# Patient Record
Sex: Female | Born: 1990 | Race: Black or African American | Hispanic: No | Marital: Single | Smoking: Current every day smoker
Health system: Southern US, Community
[De-identification: ages and names within clinical notes are randomized; demographics above are authoritative.]

## PROBLEM LIST (undated history)

## (undated) DIAGNOSIS — N200 Calculus of kidney: Secondary | ICD-10-CM

## (undated) DIAGNOSIS — O24419 Gestational diabetes mellitus in pregnancy, unspecified control: Secondary | ICD-10-CM

## (undated) DIAGNOSIS — N289 Disorder of kidney and ureter, unspecified: Secondary | ICD-10-CM

## (undated) DIAGNOSIS — O139 Gestational [pregnancy-induced] hypertension without significant proteinuria, unspecified trimester: Secondary | ICD-10-CM

## (undated) HISTORY — DX: Gestational (pregnancy-induced) hypertension without significant proteinuria, unspecified trimester: O13.9

## (undated) HISTORY — PX: URETERAL STENT PLACEMENT: SHX822

## (undated) HISTORY — DX: Gestational diabetes mellitus in pregnancy, unspecified control: O24.419

---

## 2014-02-23 ENCOUNTER — Emergency Department (HOSPITAL_COMMUNITY): Payer: Self-pay

## 2014-02-23 ENCOUNTER — Encounter (HOSPITAL_COMMUNITY): Payer: Self-pay | Admitting: Emergency Medicine

## 2014-02-23 ENCOUNTER — Emergency Department (HOSPITAL_COMMUNITY)
Admission: EM | Admit: 2014-02-23 | Discharge: 2014-02-23 | Disposition: A | Discharge status: Home / Self Care | Payer: Self-pay | Attending: Emergency Medicine | Admitting: Emergency Medicine

## 2014-02-23 DIAGNOSIS — N201 Calculus of ureter: Secondary | ICD-10-CM | POA: Insufficient documentation

## 2014-02-23 DIAGNOSIS — Z3202 Encounter for pregnancy test, result negative: Secondary | ICD-10-CM | POA: Insufficient documentation

## 2014-02-23 DIAGNOSIS — N23 Unspecified renal colic: Secondary | ICD-10-CM

## 2014-02-23 DIAGNOSIS — F17200 Nicotine dependence, unspecified, uncomplicated: Secondary | ICD-10-CM | POA: Insufficient documentation

## 2014-02-23 LAB — URINALYSIS, ROUTINE W REFLEX MICROSCOPIC
BILIRUBIN URINE: NEGATIVE
Glucose, UA: NEGATIVE mg/dL
Ketones, ur: NEGATIVE mg/dL
Nitrite: NEGATIVE
PH: 6 (ref 5.0–8.0)
Protein, ur: 30 mg/dL — AB
Specific Gravity, Urine: 1.023 (ref 1.005–1.030)
UROBILINOGEN UA: 1 mg/dL (ref 0.0–1.0)

## 2014-02-23 LAB — URINE MICROSCOPIC-ADD ON

## 2014-02-23 LAB — I-STAT CHEM 8, ED
BUN: 8 mg/dL (ref 6–23)
CREATININE: 0.7 mg/dL (ref 0.50–1.10)
Calcium, Ion: 1.18 mmol/L (ref 1.12–1.23)
Chloride: 106 mEq/L (ref 96–112)
GLUCOSE: 92 mg/dL (ref 70–99)
HEMATOCRIT: 41 % (ref 36.0–46.0)
Hemoglobin: 13.9 g/dL (ref 12.0–15.0)
POTASSIUM: 3.9 meq/L (ref 3.7–5.3)
SODIUM: 142 meq/L (ref 137–147)
TCO2: 20 mmol/L (ref 0–100)

## 2014-02-23 LAB — PREGNANCY, URINE: Preg Test, Ur: NEGATIVE

## 2014-02-23 MED ORDER — IBUPROFEN 800 MG PO TABS: 800.0000 mg | ORAL_TABLET | Freq: Three times a day (TID) | ORAL | Status: DC

## 2014-02-23 MED ORDER — HYDROMORPHONE HCL PF 1 MG/ML IJ SOLN
1.0000 mg | Freq: Once | INTRAMUSCULAR | Status: AC
  Administered 2014-02-23: 1 mg via INTRAVENOUS
  Filled 2014-02-23: qty 1

## 2014-02-23 MED ORDER — KETOROLAC TROMETHAMINE 30 MG/ML IJ SOLN
30.0000 mg | Freq: Once | INTRAMUSCULAR | Status: AC
  Administered 2014-02-23: 30 mg via INTRAVENOUS
  Filled 2014-02-23: qty 1

## 2014-02-23 MED ORDER — ONDANSETRON HCL 8 MG PO TABS: 8.0000 mg | ORAL_TABLET | Freq: Three times a day (TID) | ORAL | Status: DC | PRN

## 2014-02-23 MED ORDER — ONDANSETRON HCL 4 MG/2ML IJ SOLN
4.0000 mg | Freq: Once | INTRAMUSCULAR | Status: AC
  Administered 2014-02-23: 4 mg via INTRAVENOUS
  Filled 2014-02-23: qty 2

## 2014-02-23 NOTE — Progress Notes (Signed)
[facility] provided pt with a list of primary care resources and a [facility] [employer] Card application to help patient establish a pcp.  °

## 2014-02-23 NOTE — ED Notes (Signed)
Pt transported to CT ?

## 2014-02-23 NOTE — ED Notes (Signed)
Pt has hx of kidney stones.  Pt with sudden onset flank pain to left 2 hours ago

## 2014-02-23 NOTE — ED Provider Notes (Signed)
CSN: [id number]     Arrival date & time 02/23/14  1325 History   First MD Initiated Contact with Patient 02/23/14 1448     Chief Complaint  Patient presents with  . Flank Pain     (Consider location/radiation/quality/duration/timing/severity/associated sxs/prior Treatment) HPI  Complains of left flank pain onset 3 hours ago accompanied by several episodes of vomiting. Pain is nonradiating sharp feels like kidney stone she's had in the past. Nothing makes pain better or worse. No other associated symptoms. No treatment prior to coming here History reviewed. No pertinent past medical history. past medical history kidney stones Past Surgical History  Procedure Laterality Date  . Ureteral stent placement     History reviewed. No pertinent family history. History  Substance Use Topics  . Smoking status: Current Every Day Smoker -- 0.50 packs/day    Types: Cigarettes  . Smokeless tobacco: Not on file  . Alcohol Use: No   occasional alcohol use OB History   Grav Para Term Preterm Abortions TAB SAB Ect Mult Living                 Review of Systems  Constitutional: Negative.   HENT: Negative.   Respiratory: Negative.   Cardiovascular: Negative.   Gastrointestinal: Positive for vomiting.  Genitourinary: Positive for flank pain.       Lnmp 01/05/14  Musculoskeletal: Negative.   Skin: Negative.   Neurological: Negative.   Psychiatric/Behavioral: Negative.   All other systems reviewed and are negative.     Allergies  Review of patient's allergies indicates no known allergies.  Home Medications   Prior to Admission medications   Not on File   BP 121/81  Pulse 102  Temp(Src) 98 F (36.7 C) (Oral)  Resp 18  SpO2 100%  LMP 02/05/2014 Physical Exam  Nursing note and vitals reviewed. Constitutional: She appears well-developed and well-nourished. She appears distressed.  Appears mildly uncomfortable  HENT:  Head: Normocephalic and atraumatic.  Eyes: Conjunctivae are  normal. Pupils are equal, round, and reactive to light.  Neck: Neck supple. No tracheal deviation present. No thyromegaly present.  Cardiovascular: Normal rate and regular rhythm.   No murmur heard. Pulmonary/Chest: Effort normal and breath sounds normal.  Abdominal: Soft. Bowel sounds are normal. She exhibits no distension. There is no tenderness.  Musculoskeletal: Normal range of motion. She exhibits no edema and no tenderness.  Neurological: She is alert. Coordination normal.  Skin: Skin is warm and dry. No rash noted.  Psychiatric: She has a normal mood and affect.    ED Course  Procedures (including critical care time) Labs Review Labs Reviewed  PREGNANCY, URINE  URINALYSIS, ROUTINE W REFLEX MICROSCOPIC    Imaging Review No results found.   EKG Interpretation None     5 PM patient asymptomatic pain free after treatment with Zofran, Toradol and intravenous hydromorphone CT scan and discussed with radiologist likely passed ureteral stone left side. Results for orders placed during the hospital encounter of 02/23/14  URINALYSIS, ROUTINE W REFLEX MICROSCOPIC      Result Value Ref Range   Color, Urine AMBER (*) YELLOW   APPearance CLOUDY (*) CLEAR   Specific Gravity, Urine 1.023  1.005 - 1.030   pH 6.0  5.0 - 8.0   Glucose, UA NEGATIVE  NEGATIVE mg/dL   Hgb urine dipstick LARGE (*) NEGATIVE   Bilirubin Urine NEGATIVE  NEGATIVE   Ketones, ur NEGATIVE  NEGATIVE mg/dL   Protein, ur 30 (*) NEGATIVE mg/dL   Urobilinogen, UA 1.0  0.0 - 1.0 mg/dL   Nitrite NEGATIVE  NEGATIVE   Leukocytes, UA TRACE (*) NEGATIVE  PREGNANCY, URINE      Result Value Ref Range   Preg Test, Ur NEGATIVE  NEGATIVE  URINE MICROSCOPIC-ADD ON      Result Value Ref Range   Squamous Epithelial / LPF RARE  RARE   WBC, UA 0-2  <3 WBC/hpf   RBC / HPF TOO NUMEROUS TO COUNT  <3 RBC/hpf   Bacteria, UA RARE  RARE   Urine-Other MUCOUS PRESENT    I-STAT CHEM 8, ED      Result Value Ref Range   Sodium 142   137 - 147 mEq/L   Potassium 3.9  3.7 - 5.3 mEq/L   Chloride 106  96 - 112 mEq/L   BUN 8  6 - 23 mg/dL   Creatinine, Ser 1.610.70  0.50 - 1.10 mg/dL   Glucose, Bld 92  70 - 99 mg/dL   Calcium, Ion 0.961.18  0.451.12 - 1.23 mmol/L   TCO2 20  0 - 100 mmol/L   Hemoglobin 13.9  12.0 - 15.0 g/dL   HCT 40.941.0  81.136.0 - 91.446.0 %   Ct Abdomen Pelvis Wo Contrast  02/23/2014   CLINICAL DATA:  FLANK PAIN  EXAM: CT ABDOMEN AND PELVIS WITHOUT CONTRAST  TECHNIQUE: Multidetector CT imaging of the abdomen and pelvis was performed following the standard protocol without IV contrast.  COMPARISON:  None.  FINDINGS: Lung bases are negative.  Noncontrast evaluation of the liver, spleen, adrenals, pancreas is unremarkable.  A nonobstructing 4 mm calculus posterior mid pole right kidney. No evidence of obstructive uropathy nor ureteral lithiasis. Small 1 mm medullary calculi in the left kidney. There is trace pelvicaliectasis on the left and no evidence of ureterolithiasis. Multiple phleboliths are appreciated within the pelvis.  No abdominal or pelvic masses, free fluid, loculated fluid collections, no adenopathy within the limitations of a noncontrast CT.  The bowel, gallbladder fossa, and appendix are negative. Moderate amount of stool within the ascending colon. No abdominal aortic aneurysm.  There are no aggressive appearing osseous lesions.  IMPRESSION: Mild pelvic caliectasis within the left kidney. There are multiple 1 mm medullary calculi on the left and these findings may represent sequela of passage of a small calculus.  Nonobstructing medullary calculus right kidney  Within the limitations of a noncontrast CT no further evidence of obstructive or inflammatory abnormalities.   Electronically Signed   By: Salome HolmesHector  Cooper M.D.   On: 02/23/2014 15:29    MDM  Plan prescription ibuprofen, Zofran urology referral Final diagnoses:  None   diagnosis ureteral colic      Doug SouSam Jacubowitz, MD 02/23/14 1706

## 2014-02-23 NOTE — ED Notes (Signed)
Pt able to keep down ice water without vomiting.

## 2014-02-23 NOTE — ED Notes (Signed)
Pt reports onset of left flank pain starting 2 hours ago similar to kidney stone pain experienced in past. Pt reports n/v and pain but denies other symptoms.

## 2014-02-23 NOTE — ED Notes (Signed)
Pt actively vomiting. MD notified.  

## 2014-02-23 NOTE — Discharge Instructions (Signed)
Kidney Stones Call Dr. Madilyn HookNesi's office on 02/26/2014 to schedule an office visit. Tell office staff that you were seen here Kidney stones (urolithiasis) are solid masses that form inside your kidneys. The intense pain is caused by the stone moving through the kidney, ureter, bladder, and urethra (urinary tract). When the stone moves, the ureter starts to spasm around the stone. The stone is usually passed in your pee (urine).  HOME CARE  Drink enough fluids to keep your pee clear or pale yellow. This helps to get the stone out.  Strain all pee through the provided strainer. Do not pee without peeing through the strainer, not even once. If you pee the stone out, catch it in the strainer. The stone may be as small as a grain of salt. Take this to your doctor. This will help your doctor figure out what you can do to try to prevent more kidney stones.  Only take medicine as told by your doctor.  Follow up with your doctor as told.  Get follow-up X-rays as told by your doctor. GET HELP IF: You have pain that gets worse even if you have been taking pain medicine. GET HELP RIGHT AWAY IF:   Your pain does not get better with medicine.  You have a fever or shaking chills.  Your pain increases and gets worse over 18 hours.  You have new belly (abdominal) pain.  You feel faint or pass out.  You are unable to pee. MAKE SURE YOU:   Understand these instructions.  Will watch your condition.  Will get help right away if you are not doing well or get worse. Document Released: 02/03/2008 Document Revised: 04/19/2013 Document Reviewed: 01/18/2013 [facility] Patient Information 2015 [place], [place]. This information is not intended to replace advice given to you by your health care provider. Make sure you discuss any questions you have with your health care provider.

## 2014-04-10 ENCOUNTER — Encounter (HOSPITAL_COMMUNITY): Payer: Self-pay | Admitting: Emergency Medicine

## 2014-04-10 ENCOUNTER — Emergency Department (HOSPITAL_COMMUNITY)
Admission: EM | Admit: 2014-04-10 | Discharge: 2014-04-10 | Disposition: A | Discharge status: Home / Self Care | Payer: Self-pay | Attending: Emergency Medicine | Admitting: Emergency Medicine

## 2014-04-10 DIAGNOSIS — F17200 Nicotine dependence, unspecified, uncomplicated: Secondary | ICD-10-CM | POA: Insufficient documentation

## 2014-04-10 DIAGNOSIS — Z9889 Other specified postprocedural states: Secondary | ICD-10-CM | POA: Insufficient documentation

## 2014-04-10 DIAGNOSIS — R300 Dysuria: Secondary | ICD-10-CM | POA: Insufficient documentation

## 2014-04-10 DIAGNOSIS — Z79899 Other long term (current) drug therapy: Secondary | ICD-10-CM | POA: Insufficient documentation

## 2014-04-10 DIAGNOSIS — Z87442 Personal history of urinary calculi: Secondary | ICD-10-CM | POA: Insufficient documentation

## 2014-04-10 DIAGNOSIS — N390 Urinary tract infection, site not specified: Secondary | ICD-10-CM | POA: Insufficient documentation

## 2014-04-10 DIAGNOSIS — Z791 Long term (current) use of non-steroidal anti-inflammatories (NSAID): Secondary | ICD-10-CM | POA: Insufficient documentation

## 2014-04-10 DIAGNOSIS — Z3202 Encounter for pregnancy test, result negative: Secondary | ICD-10-CM | POA: Insufficient documentation

## 2014-04-10 HISTORY — DX: Calculus of kidney: N20.0

## 2014-04-10 HISTORY — DX: Disorder of kidney and ureter, unspecified: N28.9

## 2014-04-10 LAB — URINE MICROSCOPIC-ADD ON

## 2014-04-10 LAB — URINALYSIS, ROUTINE W REFLEX MICROSCOPIC
Bilirubin Urine: NEGATIVE
GLUCOSE, UA: NEGATIVE mg/dL
Ketones, ur: NEGATIVE mg/dL
Nitrite: NEGATIVE
Protein, ur: NEGATIVE mg/dL
SPECIFIC GRAVITY, URINE: 1.018 (ref 1.005–1.030)
Urobilinogen, UA: 1 mg/dL (ref 0.0–1.0)
pH: 7 (ref 5.0–8.0)

## 2014-04-10 LAB — POC URINE PREG, ED: Preg Test, Ur: NEGATIVE

## 2014-04-10 MED ORDER — CEPHALEXIN 500 MG PO CAPS
500.0000 mg | ORAL_CAPSULE | Freq: Once | ORAL | Status: AC
  Administered 2014-04-10: 500 mg via ORAL
  Filled 2014-04-10: qty 1

## 2014-04-10 MED ORDER — CEPHALEXIN 500 MG PO CAPS: 500.0000 mg | ORAL_CAPSULE | Freq: Four times a day (QID) | ORAL | Status: DC

## 2014-04-10 MED ORDER — HYDROCODONE-ACETAMINOPHEN 5-325 MG PO TABS
2.0000 | ORAL_TABLET | Freq: Once | ORAL | Status: AC
  Administered 2014-04-10: 2 via ORAL
  Filled 2014-04-10: qty 2

## 2014-04-10 NOTE — ED Provider Notes (Signed)
CSN: [id number]     Arrival date & time 04/10/14  1105 History   First MD Initiated Contact with Patient 04/10/14 1127     Chief Complaint  Patient presents with  . Dysuria     (Consider location/radiation/quality/duration/timing/severity/associated sxs/prior Treatment) Patient is a 23 y.o. female presenting with dysuria. The history is provided by the patient. No language interpreter was used.  Dysuria Pain quality:  Aching Pain severity:  Moderate Onset quality:  Gradual Duration:  1 day Timing:  Constant Progression:  Unchanged Chronicity:  New Recent urinary tract infections: no   Relieved by:  Nothing Worsened by:  Nothing tried Ineffective treatments:  None tried Urinary symptoms: frequent urination   Urinary symptoms: no discolored urine   Associated symptoms: abdominal pain   Associated symptoms: no fever, no nausea and no vomiting   Abdominal pain:    Location:  Suprapubic   Quality:  Aching   Severity:  Moderate   Onset quality:  Gradual   Duration:  1 day   Timing:  Constant   Progression:  Unchanged   Chronicity:  New Risk factors: sexually active   Risk factors: no hx of pyelonephritis, no hx of urolithiasis, no kidney transplant and no renal disease     Past Medical History  Diagnosis Date  . Renal disorder   . Kidney stones    Past Surgical History  Procedure Laterality Date  . Ureteral stent placement     No family history on file. History  Substance Use Topics  . Smoking status: Current Every Day Smoker -- 0.50 packs/day    Types: Cigarettes  . Smokeless tobacco: Not on file  . Alcohol Use: No   OB History   Grav Para Term Preterm Abortions TAB SAB Ect Mult Living                 Review of Systems  Constitutional: Negative for fever, chills and fatigue.  HENT: Negative for trouble swallowing.   Eyes: Negative for visual disturbance.  Respiratory: Negative for shortness of breath.   Cardiovascular: Negative for chest pain and  palpitations.  Gastrointestinal: Positive for abdominal pain. Negative for nausea, vomiting and diarrhea.  Genitourinary: Positive for dysuria. Negative for difficulty urinating.  Musculoskeletal: Negative for arthralgias and neck pain.  Skin: Negative for color change.  Neurological: Negative for dizziness and weakness.  Psychiatric/Behavioral: Negative for dysphoric mood.      Allergies  Review of patient's allergies indicates no known allergies.  Home Medications   Prior to Admission medications   Medication Sig Start Date End Date Taking? Authorizing Provider  acetaminophen (TYLENOL) 325 MG tablet Take 650 mg by mouth every 6 (six) hours as needed (pain.).   Yes Historical Provider, MD  ibuprofen (ADVIL,MOTRIN) 200 MG tablet Take 200 mg by mouth every 6 (six) hours as needed (pain.).   Yes Historical Provider, MD   BP 134/93  Pulse 91  Temp(Src) 99 F (37.2 C) (Oral)  Resp 17  SpO2 100%  LMP 04/03/2014 Physical Exam  Nursing note and vitals reviewed. Constitutional: She is oriented to person, place, and time. She appears well-developed and well-nourished. No distress.  HENT:  Head: Normocephalic and atraumatic.  Eyes: Conjunctivae are normal.  Neck: Normal range of motion.  Cardiovascular: Normal rate and regular rhythm.  Exam reveals no gallop and no friction rub.   No murmur heard. Pulmonary/Chest: Effort normal and breath sounds normal. She has no wheezes. She has no rales. She exhibits no tenderness.  Abdominal: Soft.  She exhibits no distension. There is tenderness. There is no rebound and no guarding.  Suprapubic tenderness to palpation. No other focal tenderness.   Musculoskeletal: Normal range of motion.  Neurological: She is alert and oriented to person, place, and time. Coordination normal.  Speech is goal-oriented. Moves limbs without ataxia.   Skin: Skin is warm and dry.  Psychiatric: She has a normal mood and affect. Her behavior is normal.    ED Course   Procedures (including critical care time) Labs Review Labs Reviewed  URINALYSIS, ROUTINE W REFLEX MICROSCOPIC - Abnormal; Notable for the following:    APPearance CLOUDY (*)    Hgb urine dipstick SMALL (*)    Leukocytes, UA LARGE (*)    All other components within normal limits  URINE MICROSCOPIC-ADD ON - Abnormal; Notable for the following:    Squamous Epithelial / LPF FEW (*)    Bacteria, UA FEW (*)    All other components within normal limits  POC URINE PREG, ED    Imaging Review No results found.   EKG Interpretation None      MDM   Final diagnoses:  UTI (lower urinary tract infection)    12:26 PM Urinalysis shows UTI. Patient will have Vicodin here for pain. Patient will have her first keflex dose here. Vitals stable and patient afebrile. Patient will be discharged with keflex for UTI. Patient instructed to return with worsening or concerning symptoms.   Emilia BeckKaitlyn Breeanna Galgano, PA-C 04/10/14 1236

## 2014-04-10 NOTE — ED Notes (Signed)
Pt states she woke up this morning and has had dysuria twice with each void. Pt denies blood, discharge or odor, but does have lower abd pain.  Pt has had kidney stones in past and states this is different.

## 2014-04-10 NOTE — Discharge Instructions (Signed)
Take keflex as directed until gone. Refer to attached documents for more information.  °

## 2014-04-10 NOTE — ED Provider Notes (Signed)
Medical screening examination/treatment/procedure(s) were performed by non-physician practitioner and as supervising physician I was immediately available for consultation/collaboration.   EKG Interpretation None        Lyanne CoKevin M Malone Admire, MD 04/10/14 [phone]

## 2014-04-10 NOTE — Progress Notes (Signed)
[facility] Liaison Stacy,  ° °Provided pt with a list of primary care resources and a [facility] [employer] Card application to help patient establish primary care.  °

## 2019-07-31 ENCOUNTER — Encounter: Payer: Medicaid - Out of State | Admitting: Advanced Practice Midwife

## 2019-08-16 ENCOUNTER — Encounter: Payer: Medicaid - Out of State | Admitting: Obstetrics

## 2019-08-17 ENCOUNTER — Other Ambulatory Visit: Payer: Self-pay

## 2019-08-17 ENCOUNTER — Ambulatory Visit (INDEPENDENT_AMBULATORY_CARE_PROVIDER_SITE_OTHER): Payer: Medicaid - Out of State | Admitting: Obstetrics

## 2019-08-17 ENCOUNTER — Encounter: Payer: Self-pay | Admitting: Obstetrics

## 2019-08-17 VITALS — Ht 64.0 in

## 2019-08-17 DIAGNOSIS — Z3403 Encounter for supervision of normal first pregnancy, third trimester: Secondary | ICD-10-CM

## 2019-08-17 DIAGNOSIS — Z23 Encounter for immunization: Secondary | ICD-10-CM

## 2019-08-17 DIAGNOSIS — Z3A01 Less than 8 weeks gestation of pregnancy: Secondary | ICD-10-CM

## 2019-08-17 DIAGNOSIS — O285 Abnormal chromosomal and genetic finding on antenatal screening of mother: Secondary | ICD-10-CM

## 2019-08-17 MED ORDER — BLOOD PRESSURE CUFF MISC: 1.0000 | Freq: Once | 0 refills | Status: AC

## 2019-08-17 MED ORDER — PRENATE MINI 29-0.6-0.4-350 MG PO CAPS: 1.0000 | ORAL_CAPSULE | Freq: Every day | ORAL | 3 refills | Status: DC

## 2019-08-17 MED ORDER — BLOOD PRESSURE KIT: 1.0000 | PACK | Freq: Once | 0 refills | Status: AC

## 2019-08-17 NOTE — Progress Notes (Signed)
Subjective:    Roberta Guzman is being seen today for her first obstetrical visit.  This is not a planned pregnancy. She is at Unknown gestation. Her obstetrical history is significant for abnormal genetic testing with this pregnancy.  Prenatal records unavailable for review.  Relationship with FOB: unknown. Patient does intend to breast feed. Pregnancy history fully reviewed.  The information documented in the HPI was reviewed and verified.  Menstrual History: OB History    Gravida  2   Para      Term      Preterm      AB  1   Living        SAB      TAB      Ectopic      Multiple      Live Births               No LMP recorded. Patient is pregnant.    Past Medical History:  Diagnosis Date  . Kidney stones   . Renal disorder     Past Surgical History:  Procedure Laterality Date  . URETERAL STENT PLACEMENT      (Not in a hospital admission)  No Known Allergies  Social History   Tobacco Use  . Smoking status: Current Every Day Smoker    Packs/day: 0.50    Types: Cigarettes  Substance Use Topics  . Alcohol use: No    Family History  Problem Relation Age of Onset  . Hypertension Mother   . Diabetes Mother   . Heart failure Mother   . Heart failure Father      Review of Systems Constitutional: negative for weight loss Gastrointestinal: negative for vomiting Genitourinary:negative for genital lesions and vaginal discharge and dysuria Musculoskeletal:negative for back pain Behavioral/Psych: negative for abusive relationship, depression, illegal drug usage and tobacco use    Objective:    Ht 5' 4"  (1.626 m)  General Appearance:    Alert, cooperative, no distress, appears stated age  Head:    Normocephalic, without obvious abnormality, atraumatic  Eyes:    PERRL, conjunctiva/corneas clear, EOM's intact, fundi    benign, both eyes  Ears:    Normal TM's and external ear canals, both ears  Nose:   Nares normal, septum midline, mucosa normal, no  drainage    or sinus tenderness  Throat:   Lips, mucosa, and tongue normal; teeth and gums normal  Neck:   Supple, symmetrical, trachea midline, no adenopathy;    thyroid:  no enlargement/tenderness/nodules; no carotid   bruit or JVD  Back:     Symmetric, no curvature, ROM normal, no CVA tenderness  Lungs:     Clear to auscultation bilaterally, respirations unlabored  Chest Wall:    No tenderness or deformity   Heart:    Regular rate and rhythm, S1 and S2 normal, no murmur, rub   or gallop  Breast Exam:    No tenderness, masses, or nipple abnormality  Abdomen:     Soft, non-tender, bowel sounds active all four quadrants,    no masses, no organomegaly  Genitalia:    Normal female without lesion, discharge or tenderness  Extremities:   Extremities normal, atraumatic, no cyanosis or edema  Pulses:   2+ and symmetric all extremities  Skin:   Skin color, texture, turgor normal, no rashes or lesions  Lymph nodes:   Cervical, supraclavicular, and axillary nodes normal  Neurologic:   CNII-XII intact, normal strength, sensation and reflexes    throughout  Lab Review Urine pregnancy test Labs reviewed yes Radiologic studies reviewed yes  Assessment:    Pregnancy at Unknown weeks   No prenatal records available for review.   Plan:     1. Encounter for supervision of normal first pregnancy in third trimester Rx: - Tdap vaccine greater than or equal to 7yo IM - Prenat w/o A-FeCbn-Meth-FA-DHA (PRENATE MINI) 29-0.6-0.4-350 MG CAPS; Take 1 capsule by mouth daily before breakfast.  Dispense: 90 capsule; Refill: 3 - need prenatal records  2. Abnormal genetic test in pregnancy, per patient Rx: - AMB referral to maternal fetal medicine  Prenatal vitamins.  Counseling provided regarding continued use of seat belts, cessation of alcohol consumption, smoking or use of illicit drugs; infection precautions i.e., influenza/TDAP immunizations, toxoplasmosis,CMV, parvovirus, listeria and  varicella; workplace safety, exercise during pregnancy; routine dental care, safe medications, sexual activity, hot tubs, saunas, pools, travel, caffeine use, fish and methlymercury, potential toxins, hair treatments, varicose veins Weight gain recommendations per IOM guidelines reviewed: underweight/BMI< 18.5--> gain 28 - 40 lbs; normal weight/BMI 18.5 - 24.9--> gain 25 - 35 lbs; overweight/BMI 25 - 29.9--> gain 15 - 25 lbs; obese/BMI >30->gain  11 - 20 lbs Problem list reviewed and updated. FIRST/CF mutation testing/NIPT/QUAD SCREEN/fragile X/Ashkenazi Jewish population testing/Spinal muscular atrophy discussed: requested. Role of ultrasound in pregnancy discussed; fetal survey: requested. Amniocentesis discussed: not indicated.  Meds ordered this encounter  Medications  . Blood Pressure KIT    Sig: 1 kit by Does not apply route once for 1 dose.    Dispense:  1 kit    Refill:  0  . Blood Pressure Monitoring (BLOOD PRESSURE CUFF) MISC    Sig: 1 kit by Does not apply route once for 1 dose. Small/medium    Dispense:  1 each    Refill:  0  . Prenat w/o A-FeCbn-Meth-FA-DHA (PRENATE MINI) 29-0.6-0.4-350 MG CAPS    Sig: Take 1 capsule by mouth daily before breakfast.    Dispense:  90 capsule    Refill:  3   Orders Placed This Encounter  Procedures  . Tdap vaccine greater than or equal to 7yo IM  . AMB referral to maternal fetal medicine    Referral Priority:   Routine    Referral Type:   Consultation    Referral Reason:   Specialty Services Required    Number of Visits Requested:   1    Follow up in 2 weeks. 50% of 25 min visit spent on counseling and coordination of care.     Shelly Bombard, MD 08/17/2019 12:13 PM

## 2019-08-28 ENCOUNTER — Ambulatory Visit (HOSPITAL_COMMUNITY): Payer: Medicaid - Out of State

## 2019-08-31 ENCOUNTER — Encounter (HOSPITAL_COMMUNITY): Payer: Medicaid - Out of State

## 2019-08-31 ENCOUNTER — Encounter: Payer: Self-pay | Admitting: Obstetrics and Gynecology

## 2019-08-31 ENCOUNTER — Telehealth (INDEPENDENT_AMBULATORY_CARE_PROVIDER_SITE_OTHER): Payer: Medicaid - Out of State | Admitting: Obstetrics and Gynecology

## 2019-08-31 VITALS — BP 139/90 | HR 121

## 2019-08-31 DIAGNOSIS — Z3403 Encounter for supervision of normal first pregnancy, third trimester: Secondary | ICD-10-CM

## 2019-08-31 DIAGNOSIS — Z3A32 32 weeks gestation of pregnancy: Secondary | ICD-10-CM

## 2019-08-31 NOTE — Progress Notes (Signed)
Pt is on the phone preparing for virtual visit with provider. [redacted]w[redacted]d.

## 2019-08-31 NOTE — Progress Notes (Signed)
   TELEHEALTH OBSTETRICS PRENATAL VIRTUAL VIDEO VISIT ENCOUNTER NOTE  Provider location: [facility] [place]   I connected with Roberta Guzman on 08/30/28 at 10:30 AM EST by MyChart Video Encounter at home and verified that I am speaking with the correct person using two identifiers.   I discussed the limitations, risks, security and privacy concerns of performing an evaluation and management service virtually and the availability of in person appointments. I also discussed with the patient that there may be a patient responsible charge related to this service. The patient expressed understanding and agreed to proceed. Subjective:  Roberta Guzman is a 28 y.o. G2P0010 at [redacted]w[redacted]d being seen today for ongoing prenatal care.  She is currently monitored for the following issues for this low-risk pregnancy and has Encounter for supervision of normal first pregnancy in third trimester on their problem list.  Patient reports no complaints.  Contractions: Not present. Vag. Bleeding: None.  Movement: Present. Denies any leaking of fluid.   The following portions of the patient's history were reviewed and updated as appropriate: allergies, current medications, past family history, past medical history, past social history, past surgical history and problem list.   Objective:   Vitals:   08/31/19 1029  BP: 139/90  Pulse: (!) 121    Fetal Status:     Movement: Present     General:  Alert, oriented and cooperative. Patient is in no acute distress.  Respiratory: Normal respiratory effort, no problems with respiration noted  Mental Status: Normal mood and affect. Normal behavior. Normal judgment and thought content.  Rest of physical exam deferred due to type of encounter  Imaging: No results found.  Assessment and Plan:  Pregnancy: G2P0010 at [redacted]w[redacted]d 1. Encounter for supervision of normal first pregnancy in third trimester Patient is doing well without complaints Patient to come in  next week for in person ROB with glucola and third trimester labs Follow up genetic counseling on 09/05/19 given abnormal genetic screening Elevated BP today. Will monitor closely Patient is considering Nexplanon for contraception Patient is still researching pediatricians  Preterm labor symptoms and general obstetric precautions including but not limited to vaginal bleeding, contractions, leaking of fluid and fetal movement were reviewed in detail with the patient. I discussed the assessment and treatment plan with the patient. The patient was provided an opportunity to ask questions and all were answered. The patient agreed with the plan and demonstrated an understanding of the instructions. The patient was advised to call back or seek an in-person office evaluation/go to [facility] at [facility] for any urgent or concerning symptoms. Please refer to After Visit Summary for other counseling recommendations.   I provided 11 minutes of face-to-face time during this encounter.  Return in about 1 week (around 09/07/2019) for in person, ROB, Low risk, 2 hr glucola next visit.  Future Appointments  Date Time Provider [place]  09/05/2019  1:00 PM [place] [facility]SEL[facility] [place] [facility]    Mora Bellman, MD [facility]

## 2019-09-05 ENCOUNTER — Ambulatory Visit (HOSPITAL_COMMUNITY): Payer: Medicaid - Out of State | Attending: Obstetrics and Gynecology

## 2019-09-06 ENCOUNTER — Encounter: Payer: Self-pay | Admitting: Nurse Practitioner

## 2019-09-06 DIAGNOSIS — Z9889 Other specified postprocedural states: Secondary | ICD-10-CM | POA: Insufficient documentation

## 2019-09-07 ENCOUNTER — Other Ambulatory Visit: Payer: Medicaid - Out of State

## 2019-09-07 ENCOUNTER — Ambulatory Visit (INDEPENDENT_AMBULATORY_CARE_PROVIDER_SITE_OTHER): Payer: Medicaid - Out of State | Admitting: Nurse Practitioner

## 2019-09-07 ENCOUNTER — Other Ambulatory Visit: Payer: Self-pay

## 2019-09-07 ENCOUNTER — Encounter: Payer: Self-pay | Admitting: Nurse Practitioner

## 2019-09-07 VITALS — BP 138/95 | HR 118 | Wt 179.0 lb

## 2019-09-07 DIAGNOSIS — O285 Abnormal chromosomal and genetic finding on antenatal screening of mother: Secondary | ICD-10-CM

## 2019-09-07 DIAGNOSIS — Z3403 Encounter for supervision of normal first pregnancy, third trimester: Secondary | ICD-10-CM

## 2019-09-07 DIAGNOSIS — O24419 Gestational diabetes mellitus in pregnancy, unspecified control: Secondary | ICD-10-CM

## 2019-09-07 DIAGNOSIS — Z148 Genetic carrier of other disease: Secondary | ICD-10-CM

## 2019-09-07 DIAGNOSIS — O99013 Anemia complicating pregnancy, third trimester: Secondary | ICD-10-CM

## 2019-09-07 DIAGNOSIS — Z3A33 33 weeks gestation of pregnancy: Secondary | ICD-10-CM

## 2019-09-07 DIAGNOSIS — R030 Elevated blood-pressure reading, without diagnosis of hypertension: Secondary | ICD-10-CM | POA: Insufficient documentation

## 2019-09-07 DIAGNOSIS — F17200 Nicotine dependence, unspecified, uncomplicated: Secondary | ICD-10-CM | POA: Insufficient documentation

## 2019-09-07 NOTE — Progress Notes (Signed)
    Subjective:  Roberta Guzman is a 29 y.o. G2P0010 at [redacted]w[redacted]d being seen today for ongoing prenatal care.  She is currently monitored for the following issues for this low-risk pregnancy and has Encounter for supervision of normal first pregnancy in third trimester; History of removal of ureteral stent; and Genetic carrier status on their problem list.  Patient reports periodic headaches that she has had the entire pregnancy.  the headaches most recently have been more mild and she closes her eyes to have them resolve.  She does not have any visual changes and no RUQ pain.  Has had borderline BP - never over 140/90.  Client is checking BP daily..  Contractions: Not present. Vag. Bleeding: None.  Movement: Present. Denies leaking of fluid.   The following portions of the patient's history were reviewed and updated as appropriate: allergies, current medications, past family history, past medical history, past social history, past surgical history and problem list. Problem list updated.  Objective:   Vitals:   09/07/19 0914  BP: (!) 138/95  Pulse: (!) 118  Weight: 179 lb (81.2 kg)    Fetal Status:     Movement: Present     General:  Alert, oriented and cooperative. Patient is in no acute distress.  Skin: Skin is warm and dry. No rash noted.   Cardiovascular: Normal heart rate noted  Respiratory: Normal respiratory effort, no problems with respiration noted  Abdomen: Soft, gravid, appropriate for gestational age. Pain/Pressure: Absent     Pelvic:  Cervical exam deferred        Extremities: Normal range of motion.  Edema: Trace  Mental Status: Normal mood and affect. Normal behavior. Normal judgment and thought content.   Urinalysis:      Assessment and Plan:  Pregnancy: G2P0010 at [redacted]w[redacted]d  1. Encounter for supervision of normal first pregnancy in third trimester Taking BP daily but has not been putting into babyscripts - advised to add to babyscripts daily. Reviewed all scanned records -  will request Pap smear results again - result not in prenatal records  - Glucose Tolerance, 2 Hours w/1 Hour - HIV antibody - CBC - RPR  2. Abnormal genetic test in pregnancy Missed genetic counseling appointment yesterday.  Realizes this baby could be affected with genetic dystriophiopathy but does not want amniocentesis and does not want to talk to genetic counselor.  3. Elevated BP without diagnosis of hypertension Does not yet meet the criteria for gestational hypertension - consulted with Dr. Earlene Plater Will draw labs here today and recheck BP in one week in office ROB  Preterm labor symptoms and general obstetric precautions including but not limited to vaginal bleeding, contractions, leaking of fluid and fetal movement were reviewed in detail with the patient. Please refer to After Visit Summary for other counseling recommendations.  Return in about 1 week (around 09/14/2019) for in person ROB - BP monitoring.  Nolene Bernheim, RN, MSN, NP-BC Nurse Practitioner, [facility] for [employer], [facility] 09/07/2019 2:01 PM

## 2019-09-08 LAB — COMPREHENSIVE METABOLIC PANEL
ALT: 7 IU/L (ref 0–32)
AST: 9 IU/L (ref 0–40)
Albumin/Globulin Ratio: 1.4 (ref 1.2–2.2)
Albumin: 3.3 g/dL — ABNORMAL LOW (ref 3.9–5.0)
Alkaline Phosphatase: 85 IU/L (ref 39–117)
BUN/Creatinine Ratio: 9 (ref 9–23)
BUN: 5 mg/dL — ABNORMAL LOW (ref 6–20)
Bilirubin Total: 0.4 mg/dL (ref 0.0–1.2)
CO2: 18 mmol/L — ABNORMAL LOW (ref 20–29)
Calcium: 8.5 mg/dL — ABNORMAL LOW (ref 8.7–10.2)
Chloride: 108 mmol/L — ABNORMAL HIGH (ref 96–106)
Creatinine, Ser: 0.56 mg/dL — ABNORMAL LOW (ref 0.57–1.00)
GFR calc Af Amer: 146 mL/min/{1.73_m2} (ref >59)
GFR calc non Af Amer: 126 mL/min/{1.73_m2} (ref >59)
Globulin, Total: 2.4 g/dL (ref 1.5–4.5)
Glucose: 161 mg/dL — ABNORMAL HIGH (ref 65–99)
Potassium: 3.2 mmol/L — ABNORMAL LOW (ref 3.5–5.2)
Sodium: 138 mmol/L (ref 134–144)
Total Protein: 5.7 g/dL — ABNORMAL LOW (ref 6.0–8.5)

## 2019-09-08 LAB — CBC
Hematocrit: 28.3 % — ABNORMAL LOW (ref 34.0–46.6)
Hemoglobin: 9.5 g/dL — ABNORMAL LOW (ref 11.1–15.9)
MCH: 31.1 pg (ref 26.6–33.0)
MCHC: 33.6 g/dL (ref 31.5–35.7)
MCV: 93 fL (ref 79–97)
Platelets: 137 10*3/uL — ABNORMAL LOW (ref 150–450)
RBC: 3.05 x10E6/uL — ABNORMAL LOW (ref 3.77–5.28)
RDW: 12.9 % (ref 11.7–15.4)
WBC: 8.1 10*3/uL (ref 3.4–10.8)

## 2019-09-08 LAB — GLUCOSE TOLERANCE, 2 HOURS W/ 1HR
Glucose, 1 hour: 193 mg/dL — ABNORMAL HIGH (ref 65–179)
Glucose, 2 hour: 164 mg/dL — ABNORMAL HIGH (ref 65–152)
Glucose, Fasting: 90 mg/dL (ref 65–91)

## 2019-09-08 LAB — PROTEIN / CREATININE RATIO, URINE
Creatinine, Urine: 197.7 mg/dL
Protein, Ur: 34.2 mg/dL
Protein/Creat Ratio: 173 mg/g creat (ref 0–200)

## 2019-09-08 LAB — RPR: RPR Ser Ql: NONREACTIVE

## 2019-09-08 LAB — HIV ANTIBODY (ROUTINE TESTING W REFLEX): HIV Screen 4th Generation wRfx: NONREACTIVE

## 2019-09-09 ENCOUNTER — Encounter: Payer: Self-pay | Admitting: Nurse Practitioner

## 2019-09-09 DIAGNOSIS — O99019 Anemia complicating pregnancy, unspecified trimester: Secondary | ICD-10-CM | POA: Insufficient documentation

## 2019-09-09 DIAGNOSIS — O24419 Gestational diabetes mellitus in pregnancy, unspecified control: Secondary | ICD-10-CM | POA: Insufficient documentation

## 2019-09-09 MED ORDER — ASPIRIN EC 81 MG PO TBEC: 81.0000 mg | DELAYED_RELEASE_TABLET | Freq: Every day | ORAL | 4 refills | Status: AC

## 2019-09-09 MED ORDER — DOCUSATE CALCIUM 240 MG PO CAPS: 240.0000 mg | ORAL_CAPSULE | Freq: Every day | ORAL | 2 refills | Status: DC

## 2019-09-09 MED ORDER — POLYSACCHARIDE IRON COMPLEX 150 MG PO CAPS: 150.0000 mg | ORAL_CAPSULE | Freq: Every day | ORAL | 6 refills | Status: AC

## 2019-09-09 NOTE — Addendum Note (Signed)
Addended by: Currie Paris on: 09/09/2019 02:37 PM   Modules accepted: Orders

## 2019-09-11 ENCOUNTER — Other Ambulatory Visit: Payer: Self-pay

## 2019-09-11 ENCOUNTER — Other Ambulatory Visit: Payer: Self-pay | Admitting: Nurse Practitioner

## 2019-09-11 DIAGNOSIS — Z3403 Encounter for supervision of normal first pregnancy, third trimester: Secondary | ICD-10-CM

## 2019-09-11 DIAGNOSIS — O24419 Gestational diabetes mellitus in pregnancy, unspecified control: Secondary | ICD-10-CM

## 2019-09-11 NOTE — Progress Notes (Signed)
Link for babyscripts sent to patients email, pt made aware.

## 2019-09-11 NOTE — Progress Notes (Signed)
Baby scripts link sent to different email per pt request.

## 2019-09-11 NOTE — Progress Notes (Signed)
amb  

## 2019-09-12 ENCOUNTER — Encounter: Payer: Self-pay | Admitting: Obstetrics and Gynecology

## 2019-09-12 DIAGNOSIS — O09899 Supervision of other high risk pregnancies, unspecified trimester: Secondary | ICD-10-CM | POA: Insufficient documentation

## 2019-09-14 ENCOUNTER — Encounter: Payer: Medicaid - Out of State | Admitting: Certified Nurse Midwife

## 2019-09-18 ENCOUNTER — Encounter: Payer: Self-pay | Admitting: Advanced Practice Midwife

## 2019-09-18 ENCOUNTER — Ambulatory Visit (INDEPENDENT_AMBULATORY_CARE_PROVIDER_SITE_OTHER): Payer: Medicaid - Out of State | Admitting: Advanced Practice Midwife

## 2019-09-18 ENCOUNTER — Other Ambulatory Visit: Payer: Self-pay

## 2019-09-18 ENCOUNTER — Other Ambulatory Visit (HOSPITAL_COMMUNITY)
Admission: RE | Admit: 2019-09-18 | Discharge: 2019-09-18 | Disposition: A | Discharge status: Home / Self Care | Payer: Medicaid - Out of State | Source: Ambulatory Visit | Attending: Advanced Practice Midwife | Admitting: Advanced Practice Midwife

## 2019-09-18 VITALS — BP 140/90 | HR 121 | Wt 180.0 lb

## 2019-09-18 DIAGNOSIS — O133 Gestational [pregnancy-induced] hypertension without significant proteinuria, third trimester: Secondary | ICD-10-CM

## 2019-09-18 DIAGNOSIS — Z3A35 35 weeks gestation of pregnancy: Secondary | ICD-10-CM

## 2019-09-18 NOTE — Progress Notes (Signed)
   PRENATAL VISIT NOTE  Subjective:  Roberta Guzman is a 29 y.o. G3P0020 at 50w3dbeing seen today for ongoing prenatal care.  She is currently monitored for the following issues for this high-risk pregnancy and has Encounter for supervision of normal first pregnancy in third trimester; History of removal of ureteral stent; Genetic carrier status; Elevated BP without diagnosis of hypertension; Current smoker; Anemia in pregnancy; Gestational diabetes mellitus (GDM) affecting first pregnancy; and Short interval between pregnancies affecting pregnancy, antepartum on their problem list.  Patient reports no bleeding, no contractions, no headache, no visual changes. Patient feels well with no complaints.   .  .  Movement: Present. Denies leaking of fluid.   The following portions of the patient's history were reviewed and updated as appropriate: allergies, current medications, past family history, past medical history, past social history, past surgical history and problem list.   Objective:   Vitals:   09/18/19 1014 09/18/19 1015  BP: (!) 160/91 140/90  Pulse: (!) 121   Weight: 180 lb (81.6 kg)   .vs  Fetal Status: Fetal Heart Rate (bpm): 140   Movement: Present     General:  Alert, oriented and cooperative. Patient is in no acute distress.  Skin: Skin is warm and dry. No rash noted.   Cardiovascular: Normal heart rate noted  Respiratory: Normal respiratory effort, no problems with respiration noted  Abdomen: Soft, gravid, appropriate for gestational age.  Pain/Pressure: Absent     Pelvic: Cervical exam deferred        Extremities: Normal range of motion.     Mental Status: Normal mood and affect. Normal behavior. Normal judgment and thought content.   Assessment and Plan:  Pregnancy: [id number] 351w3d. Gestational hypertension, third trimester Repeat high BP measurement confirms diagnosis of gestational hypertension. Patient denies symptoms of headache and visual changes concerning for  preeclampsia. Patient has not been taking any medications [place] [facility] Ordered CMP and Protein/Cr ratio to rule out preeclampsia. Cautioned patient to visit MAU and that pre-term delivery will be necessary if preeclampsia symptoms develop or if labs are abnormal. Scheduled nurse visit on 09/21/19 for repeat BP measurement. Scheduled IOL for 37 weeks due to new diagnosis of gestational HTN. Swabbed for GBS and GC/Chlamydia. - CBC - Comp Met (CMET) - Protein / creatinine ratio, urine - Urinalysis, Routine w reflex microscopic - Culture, beta strep (group b only) - GC/Chlamydia probe amp ([place])not at [facility] symptoms and general obstetric precautions including but not limited to vaginal bleeding, contractions, leaking of fluid and fetal movement were reviewed in detail with the patient. Please refer to After Visit Summary for other counseling recommendations.   No follow-ups on file.  Future Appointments  Date Time Provider DeOmar1/21/2021 11:00 AM CWSibleyone  09/25/2019  1:40 PM Leftwich-Kirby, LiKathie DikeCNM [facility] None  09/29/2019 12:00 AM [facility] SCPuryearone    BuHettie HolsteinMedical Student

## 2019-09-19 ENCOUNTER — Telehealth (HOSPITAL_COMMUNITY): Payer: Self-pay | Admitting: *Deleted

## 2019-09-19 LAB — CBC
Hematocrit: 29 % — ABNORMAL LOW (ref 34.0–46.6)
Hemoglobin: 9.7 g/dL — ABNORMAL LOW (ref 11.1–15.9)
MCH: 30.8 pg (ref 26.6–33.0)
MCHC: 33.4 g/dL (ref 31.5–35.7)
MCV: 92 fL (ref 79–97)
Platelets: 138 10*3/uL — ABNORMAL LOW (ref 150–450)
RBC: 3.15 x10E6/uL — ABNORMAL LOW (ref 3.77–5.28)
RDW: 12.5 % (ref 11.7–15.4)
WBC: 8.3 10*3/uL (ref 3.4–10.8)

## 2019-09-19 LAB — URINALYSIS, ROUTINE W REFLEX MICROSCOPIC
Bilirubin, UA: NEGATIVE
Glucose, UA: NEGATIVE
Ketones, UA: NEGATIVE
Nitrite, UA: NEGATIVE
RBC, UA: NEGATIVE
Specific Gravity, UA: 1.019 (ref 1.005–1.030)
Urobilinogen, Ur: 1 mg/dL (ref 0.2–1.0)
pH, UA: 7 (ref 5.0–7.5)

## 2019-09-19 LAB — COMPREHENSIVE METABOLIC PANEL
ALT: 11 IU/L (ref 0–32)
AST: 12 IU/L (ref 0–40)
Albumin/Globulin Ratio: 1.6 (ref 1.2–2.2)
Albumin: 3.6 g/dL — ABNORMAL LOW (ref 3.9–5.0)
Alkaline Phosphatase: 106 IU/L (ref 39–117)
BUN/Creatinine Ratio: 8 — ABNORMAL LOW (ref 9–23)
BUN: 4 mg/dL — ABNORMAL LOW (ref 6–20)
Bilirubin Total: 0.8 mg/dL (ref 0.0–1.2)
CO2: 19 mmol/L — ABNORMAL LOW (ref 20–29)
Calcium: 8.4 mg/dL — ABNORMAL LOW (ref 8.7–10.2)
Chloride: 109 mmol/L — ABNORMAL HIGH (ref 96–106)
Creatinine, Ser: 0.51 mg/dL — ABNORMAL LOW (ref 0.57–1.00)
GFR calc Af Amer: 150 mL/min/{1.73_m2} (ref >59)
GFR calc non Af Amer: 130 mL/min/{1.73_m2} (ref >59)
Globulin, Total: 2.3 g/dL (ref 1.5–4.5)
Glucose: 86 mg/dL (ref 65–99)
Potassium: 3.6 mmol/L (ref 3.5–5.2)
Sodium: 140 mmol/L (ref 134–144)
Total Protein: 5.9 g/dL — ABNORMAL LOW (ref 6.0–8.5)

## 2019-09-19 LAB — GC/CHLAMYDIA PROBE AMP (~~LOC~~) NOT AT ARMC
Chlamydia: NEGATIVE
Comment: NEGATIVE
Comment: NORMAL
Neisseria Gonorrhea: NEGATIVE

## 2019-09-19 LAB — PROTEIN / CREATININE RATIO, URINE
Creatinine, Urine: 171.4 mg/dL
Protein, Ur: 31.8 mg/dL
Protein/Creat Ratio: 186 mg/g creat (ref 0–200)

## 2019-09-19 LAB — MICROSCOPIC EXAMINATION
Casts: NONE SEEN /lpf
Epithelial Cells (non renal): >10 /hpf — AB (ref 0–10)

## 2019-09-19 NOTE — Telephone Encounter (Signed)
Preadmission screen  

## 2019-09-20 ENCOUNTER — Telehealth (HOSPITAL_COMMUNITY): Payer: Self-pay | Admitting: *Deleted

## 2019-09-20 ENCOUNTER — Encounter (HOSPITAL_COMMUNITY): Payer: Self-pay | Admitting: *Deleted

## 2019-09-20 NOTE — Telephone Encounter (Signed)
Preadmission screen  

## 2019-09-21 ENCOUNTER — Ambulatory Visit: Payer: Medicaid - Out of State

## 2019-09-22 LAB — CULTURE, BETA STREP (GROUP B ONLY): Strep Gp B Culture: NEGATIVE

## 2019-09-24 ENCOUNTER — Other Ambulatory Visit: Payer: Self-pay | Admitting: Family Medicine

## 2019-09-25 ENCOUNTER — Encounter: Payer: Medicaid - Out of State | Admitting: Advanced Practice Midwife

## 2019-09-27 ENCOUNTER — Other Ambulatory Visit: Payer: Self-pay | Admitting: Advanced Practice Midwife

## 2019-09-27 ENCOUNTER — Other Ambulatory Visit (HOSPITAL_COMMUNITY): Admission: RE | Admit: 2019-09-27 | Payer: Medicaid - Out of State | Source: Ambulatory Visit

## 2019-09-29 ENCOUNTER — Inpatient Hospital Stay (HOSPITAL_COMMUNITY)
Admission: AD | Admit: 2019-09-29 | Payer: Medicaid - Out of State | Source: Home / Self Care | Admitting: Family Medicine

## 2019-09-29 ENCOUNTER — Inpatient Hospital Stay (HOSPITAL_COMMUNITY): Payer: Medicaid - Out of State

## 2019-09-29 NOTE — Progress Notes (Addendum)
Pt currently has not presented for IOL scheduled 09/29/19 0000. Called patient regarding scheduled IOL. No answer. Left message for patient to return call to charge RN. Dr Hale Bogus aware

## 2019-09-29 NOTE — Progress Notes (Signed)
Attempted to contact patient again via phone call regarding scheduled IOL. No answer, voicemail left.

## 2019-12-11 ENCOUNTER — Other Ambulatory Visit: Payer: Self-pay

## 2019-12-11 ENCOUNTER — Emergency Department (HOSPITAL_COMMUNITY): Payer: Medicaid - Out of State

## 2019-12-11 ENCOUNTER — Encounter (HOSPITAL_COMMUNITY): Payer: Self-pay | Admitting: Emergency Medicine

## 2019-12-11 ENCOUNTER — Inpatient Hospital Stay (HOSPITAL_COMMUNITY)
Admission: EM | Admit: 2019-12-11 | Discharge: 2019-12-14 | DRG: 291 | Disposition: A | Discharge status: Home / Self Care | Payer: Medicaid - Out of State | Attending: Internal Medicine | Admitting: Internal Medicine

## 2019-12-11 DIAGNOSIS — Z87442 Personal history of urinary calculi: Secondary | ICD-10-CM

## 2019-12-11 DIAGNOSIS — Z7982 Long term (current) use of aspirin: Secondary | ICD-10-CM

## 2019-12-11 DIAGNOSIS — R778 Other specified abnormalities of plasma proteins: Secondary | ICD-10-CM | POA: Diagnosis present

## 2019-12-11 DIAGNOSIS — I088 Other rheumatic multiple valve diseases: Secondary | ICD-10-CM | POA: Diagnosis present

## 2019-12-11 DIAGNOSIS — I5041 Acute combined systolic (congestive) and diastolic (congestive) heart failure: Secondary | ICD-10-CM | POA: Diagnosis present

## 2019-12-11 DIAGNOSIS — R7989 Other specified abnormal findings of blood chemistry: Secondary | ICD-10-CM | POA: Diagnosis present

## 2019-12-11 DIAGNOSIS — I5082 Biventricular heart failure: Secondary | ICD-10-CM | POA: Diagnosis present

## 2019-12-11 DIAGNOSIS — I248 Other forms of acute ischemic heart disease: Secondary | ICD-10-CM | POA: Diagnosis present

## 2019-12-11 DIAGNOSIS — F17200 Nicotine dependence, unspecified, uncomplicated: Secondary | ICD-10-CM | POA: Diagnosis present

## 2019-12-11 DIAGNOSIS — O903 Peripartum cardiomyopathy: Secondary | ICD-10-CM | POA: Diagnosis present

## 2019-12-11 DIAGNOSIS — I10 Essential (primary) hypertension: Secondary | ICD-10-CM | POA: Diagnosis present

## 2019-12-11 DIAGNOSIS — R609 Edema, unspecified: Secondary | ICD-10-CM | POA: Diagnosis not present

## 2019-12-11 DIAGNOSIS — F17210 Nicotine dependence, cigarettes, uncomplicated: Secondary | ICD-10-CM | POA: Diagnosis present

## 2019-12-11 DIAGNOSIS — I110 Hypertensive heart disease with heart failure: Principal | ICD-10-CM | POA: Diagnosis present

## 2019-12-11 DIAGNOSIS — Z8249 Family history of ischemic heart disease and other diseases of the circulatory system: Secondary | ICD-10-CM

## 2019-12-11 DIAGNOSIS — Z79899 Other long term (current) drug therapy: Secondary | ICD-10-CM

## 2019-12-11 DIAGNOSIS — Z20822 Contact with and (suspected) exposure to covid-19: Secondary | ICD-10-CM | POA: Diagnosis present

## 2019-12-11 DIAGNOSIS — R0602 Shortness of breath: Secondary | ICD-10-CM

## 2019-12-11 DIAGNOSIS — D509 Iron deficiency anemia, unspecified: Secondary | ICD-10-CM | POA: Diagnosis present

## 2019-12-11 DIAGNOSIS — I509 Heart failure, unspecified: Secondary | ICD-10-CM

## 2019-12-11 DIAGNOSIS — I313 Pericardial effusion (noninflammatory): Secondary | ICD-10-CM | POA: Diagnosis present

## 2019-12-11 LAB — CBC WITH DIFFERENTIAL/PLATELET
Abs Immature Granulocytes: 0.04 10*3/uL (ref 0.00–0.07)
Basophils Absolute: 0 10*3/uL (ref 0.0–0.1)
Basophils Relative: 0 %
Eosinophils Absolute: 0.1 10*3/uL (ref 0.0–0.5)
Eosinophils Relative: 1 %
HCT: 28 % — ABNORMAL LOW (ref 36.0–46.0)
Hemoglobin: 8.1 g/dL — ABNORMAL LOW (ref 12.0–15.0)
Immature Granulocytes: 1 %
Lymphocytes Relative: 32 %
Lymphs Abs: 2.4 10*3/uL (ref 0.7–4.0)
MCH: 24.1 pg — ABNORMAL LOW (ref 26.0–34.0)
MCHC: 28.9 g/dL — ABNORMAL LOW (ref 30.0–36.0)
MCV: 83.3 fL (ref 80.0–100.0)
Monocytes Absolute: 0.6 10*3/uL (ref 0.1–1.0)
Monocytes Relative: 8 %
Neutro Abs: 4.4 10*3/uL (ref 1.7–7.7)
Neutrophils Relative %: 58 %
Platelets: 259 10*3/uL (ref 150–400)
RBC: 3.36 MIL/uL — ABNORMAL LOW (ref 3.87–5.11)
RDW: 18.6 % — ABNORMAL HIGH (ref 11.5–15.5)
WBC: 7.5 10*3/uL (ref 4.0–10.5)
nRBC: 0.3 % — ABNORMAL HIGH (ref 0.0–0.2)

## 2019-12-11 LAB — COMPREHENSIVE METABOLIC PANEL
ALT: 24 U/L (ref 0–44)
AST: 27 U/L (ref 15–41)
Albumin: 2.9 g/dL — ABNORMAL LOW (ref 3.5–5.0)
Alkaline Phosphatase: 47 U/L (ref 38–126)
Anion gap: 8 (ref 5–15)
BUN: 18 mg/dL (ref 6–20)
CO2: 23 mmol/L (ref 22–32)
Calcium: 8.3 mg/dL — ABNORMAL LOW (ref 8.9–10.3)
Chloride: 108 mmol/L (ref 98–111)
Creatinine, Ser: 0.94 mg/dL (ref 0.44–1.00)
GFR calc Af Amer: >60 mL/min (ref >60)
GFR calc non Af Amer: >60 mL/min (ref >60)
Glucose, Bld: 100 mg/dL — ABNORMAL HIGH (ref 70–99)
Potassium: 3.5 mmol/L (ref 3.5–5.1)
Sodium: 139 mmol/L (ref 135–145)
Total Bilirubin: 1.8 mg/dL — ABNORMAL HIGH (ref 0.3–1.2)
Total Protein: 5.9 g/dL — ABNORMAL LOW (ref 6.5–8.1)

## 2019-12-11 LAB — BRAIN NATRIURETIC PEPTIDE: B Natriuretic Peptide: 1377.9 pg/mL — ABNORMAL HIGH (ref 0.0–100.0)

## 2019-12-11 LAB — D-DIMER, QUANTITATIVE: D-Dimer, Quant: 4.67 ug/mL-FEU — ABNORMAL HIGH (ref 0.00–0.50)

## 2019-12-11 LAB — I-STAT BETA HCG BLOOD, ED (MC, WL, AP ONLY): I-stat hCG, quantitative: <5 m[IU]/mL (ref <5)

## 2019-12-11 LAB — TROPONIN I (HIGH SENSITIVITY): Troponin I (High Sensitivity): 21 ng/L — ABNORMAL HIGH (ref <18)

## 2019-12-11 MED ORDER — IOHEXOL 350 MG/ML SOLN
80.0000 mL | Freq: Once | INTRAVENOUS | Status: AC | PRN
  Administered 2019-12-11: 80 mL via INTRAVENOUS

## 2019-12-11 MED ORDER — SODIUM CHLORIDE (PF) 0.9 % IJ SOLN
INTRAMUSCULAR | Status: AC
  Filled 2019-12-11: qty 50

## 2019-12-11 NOTE — ED Triage Notes (Signed)
Per [facility] pt from home for left leg/ankle swelling for several days that is pitting. Having congestion and coughing up clear mucous. Taking OTC diuretic and congestion meds. Pt was preeclamptic and recently post partum. Family hx CHF and pt concerned for that.   Vitals 130/90, 110HR, 16R, 100%, CBG 113.

## 2019-12-11 NOTE — ED Notes (Signed)
Vascular at bedside

## 2019-12-11 NOTE — ED Provider Notes (Signed)
[facility]-EMERGENCY DEPT Provider Note   CSN: [id number] Arrival date & time: 12/11/19  1814     History Chief Complaint  Patient presents with  . Leg Swelling    Roberta Guzman is a 29 y.o. female.  Patient is a 29 year old female with past medical history of renal calculi, pregnancy-induced hypertension, and recent childbirth the end of February of this year.  She presents today for evaluation of leg swelling and dyspnea.  This began shortly after delivery, however has been worsening recently.  Patient describes feeling short of breath when she walks long distances.  She is also noticing swelling to both ankles and feet.  She was prescribed a diuretic and has taken 2 doses of this.  She has urinated, however her symptoms persist.  The history is provided by the patient.       Past Medical History:  Diagnosis Date  . Gestational diabetes   . Kidney stones   . Pregnancy induced hypertension   . Renal disorder     Patient Active Problem List   Diagnosis Date Noted  . Short interval between pregnancies affecting pregnancy, antepartum 09/12/2019  . Anemia in pregnancy 09/09/2019  . Gestational diabetes mellitus (GDM) affecting first pregnancy 09/09/2019  . Genetic carrier status 09/07/2019  . Elevated BP without diagnosis of hypertension 09/07/2019  . Current smoker 09/07/2019  . History of removal of ureteral stent 09/06/2019  . Encounter for supervision of normal first pregnancy in third trimester 08/17/2019    Past Surgical History:  Procedure Laterality Date  . URETERAL STENT PLACEMENT       OB History    Gravida  3   Para      Term      Preterm      AB  2   Living        SAB      TAB  1   Ectopic      Multiple      Live Births              Family History  Problem Relation Age of Onset  . Hypertension Mother   . Heart failure Mother   . Heart failure Father     Social History   Tobacco Use  . Smoking status:  Current Every Day Smoker    Packs/day: 0.50    Types: Cigarettes  . Smokeless tobacco: Never Used  Substance Use Topics  . Alcohol use: No  . Drug use: No    Home Medications Prior to Admission medications   Medication Sig Start Date End Date Taking? Authorizing Provider  acetaminophen (TYLENOL) 325 MG tablet Take 650 mg by mouth every 6 (six) hours as needed (pain.).    [provider]  aspirin EC 81 MG tablet Take 1 tablet (81 mg total) by mouth daily. Take daily for preeclampsia prevention. 09/09/19   Burleson, Brand Males, NP  docusate calcium (SURFAK) 240 MG capsule Take 1 capsule (240 mg total) by mouth daily. 09/09/19   Burleson, Brand Males, NP  iron polysaccharides (NIFEREX) 150 MG capsule Take 1 capsule (150 mg total) by mouth daily. 09/09/19   Currie Paris, NP  Prenat w/o A-FeCbn-Meth-FA-DHA (PRENATE MINI) 29-0.6-0.4-350 MG CAPS Take 1 capsule by mouth daily before breakfast. 08/17/19   Brock Bad, MD    Allergies    Patient has no known allergies.  Review of Systems   Review of Systems  All other systems reviewed and are negative.  Physical Exam Updated Vital Signs BP (!) 131/102 (BP Location: Left Arm)   Pulse (!) 109   Temp 98.3 F (36.8 C) (Oral)   Resp (!) 26   Ht 5\' 4"  (1.626 m)   Wt 74.8 kg   LMP 01/13/2019 (Exact Date)   SpO2 100%   Breastfeeding Unknown   BMI 28.32 kg/m   Physical Exam Vitals and nursing note reviewed.  Constitutional:      General: She is not in acute distress.    Appearance: She is well-developed. She is not diaphoretic.  HENT:     Head: Normocephalic and atraumatic.  Cardiovascular:     Rate and Rhythm: Regular rhythm. Tachycardia present.     Heart sounds: No murmur. No friction rub. No gallop.   Pulmonary:     Effort: Pulmonary effort is normal. No respiratory distress.     Breath sounds: Normal breath sounds. No wheezing.  Abdominal:     General: Bowel sounds are normal. There is no distension.     Palpations:  Abdomen is soft.     Tenderness: There is no abdominal tenderness.  Musculoskeletal:        General: Normal range of motion.     Cervical back: Normal range of motion and neck supple.     Right lower leg: Edema present.     Left lower leg: Edema present.     Comments: There is 1-2+ pitting edema of both ankles and feet.  Skin:    General: Skin is warm and dry.  Neurological:     Mental Status: She is alert and oriented to person, place, and time.     ED Results / Procedures / Treatments   Labs (all labs ordered are listed, but only abnormal results are displayed) Labs Reviewed  BRAIN NATRIURETIC PEPTIDE  CBC WITH DIFFERENTIAL/PLATELET  D-DIMER, QUANTITATIVE (NOT AT [facility])  COMPREHENSIVE METABOLIC PANEL  I-STAT BETA HCG BLOOD, ED ([facility], [facility], [facility] ONLY)  TROPONIN I (HIGH SENSITIVITY)    EKG EKG Interpretation  Date/Time:  Monday December 11 2019 18:34:36 EDT Ventricular Rate:  108 PR Interval:    QRS Duration: 146 QT Interval:  483 QTC Calculation: 648 R Axis:   67 Text Interpretation: Sinus tachycardia Probable left atrial enlargement Nonspecific intraventricular conduction delay Borderline abnrm T, anterolateral leads No prior ecg for comparison Confirmed by Veryl Speak [phone]) on 12/11/2019 7:08:20 PM   Radiology No results found.  Procedures Procedures (including critical care time)  Medications Ordered in ED Medications - No data to display  ED Course  I have reviewed the triage vital signs and the nursing notes.  Pertinent labs & imaging results that were available during my care of the patient were reviewed by me and considered in my medical decision making (see chart for details).    MDM Rules/Calculators/A&P  Patient presenting here with progressive leg swelling and dyspnea on exertion that has worsened since the delivery of her child the end of February.  Patient does have pitting edema on exam.  Work-up reveals an elevated BNP of 1400, hemoglobin of 8.1, and  chest x-ray/CTA showing cardiomegaly, pleural effusions, and third spacing of fluid.  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  This was discussed with Dr. Flossie Buffy from the hospitalist service who will evaluate and admit.  CRITICAL CARE Performed by: Veryl Speak Total critical care time: 35 minutes Critical care time was exclusive of separately billable procedures and treating other patients. Critical care was necessary to treat or prevent  imminent or life-threatening deterioration. Critical care was time spent personally by me on the following activities: development of treatment plan with patient and/or surrogate as well as nursing, discussions with consultants, evaluation of patient's response to treatment, examination of patient, obtaining history from patient or surrogate, ordering and performing treatments and interventions, ordering and review of laboratory studies, ordering and review of radiographic studies, pulse oximetry and re-evaluation of patient's condition.   Final Clinical Impression(s) / ED Diagnoses Final diagnoses:  None    Rx / DC Orders ED Discharge Orders    None       Geoffery Lyons, MD 12/12/19 0001

## 2019-12-11 NOTE — ED Notes (Signed)
Called CT to see status

## 2019-12-11 NOTE — Progress Notes (Signed)
Lower extremity venous has been completed.   Preliminary results in CV Proc.   Blanch Media 12/11/2019 7:33 PM

## 2019-12-11 NOTE — ED Notes (Signed)
Pt transported to CT ?

## 2019-12-12 ENCOUNTER — Encounter (HOSPITAL_COMMUNITY): Payer: Self-pay | Admitting: Family Medicine

## 2019-12-12 ENCOUNTER — Inpatient Hospital Stay (HOSPITAL_COMMUNITY): Payer: Medicaid - Out of State

## 2019-12-12 DIAGNOSIS — Z8249 Family history of ischemic heart disease and other diseases of the circulatory system: Secondary | ICD-10-CM | POA: Diagnosis not present

## 2019-12-12 DIAGNOSIS — I5082 Biventricular heart failure: Secondary | ICD-10-CM | POA: Diagnosis present

## 2019-12-12 DIAGNOSIS — I110 Hypertensive heart disease with heart failure: Secondary | ICD-10-CM | POA: Diagnosis not present

## 2019-12-12 DIAGNOSIS — I5021 Acute systolic (congestive) heart failure: Secondary | ICD-10-CM

## 2019-12-12 DIAGNOSIS — I5041 Acute combined systolic (congestive) and diastolic (congestive) heart failure: Secondary | ICD-10-CM | POA: Diagnosis present

## 2019-12-12 DIAGNOSIS — M7989 Other specified soft tissue disorders: Secondary | ICD-10-CM | POA: Diagnosis not present

## 2019-12-12 DIAGNOSIS — Z79899 Other long term (current) drug therapy: Secondary | ICD-10-CM | POA: Diagnosis not present

## 2019-12-12 DIAGNOSIS — F17200 Nicotine dependence, unspecified, uncomplicated: Secondary | ICD-10-CM

## 2019-12-12 DIAGNOSIS — Z87442 Personal history of urinary calculi: Secondary | ICD-10-CM | POA: Diagnosis not present

## 2019-12-12 DIAGNOSIS — R778 Other specified abnormalities of plasma proteins: Secondary | ICD-10-CM | POA: Diagnosis not present

## 2019-12-12 DIAGNOSIS — F17210 Nicotine dependence, cigarettes, uncomplicated: Secondary | ICD-10-CM | POA: Diagnosis present

## 2019-12-12 DIAGNOSIS — I248 Other forms of acute ischemic heart disease: Secondary | ICD-10-CM | POA: Diagnosis present

## 2019-12-12 DIAGNOSIS — Z20822 Contact with and (suspected) exposure to covid-19: Secondary | ICD-10-CM | POA: Diagnosis present

## 2019-12-12 DIAGNOSIS — Z7982 Long term (current) use of aspirin: Secondary | ICD-10-CM | POA: Diagnosis not present

## 2019-12-12 DIAGNOSIS — I10 Essential (primary) hypertension: Secondary | ICD-10-CM | POA: Diagnosis present

## 2019-12-12 DIAGNOSIS — I088 Other rheumatic multiple valve diseases: Secondary | ICD-10-CM | POA: Diagnosis present

## 2019-12-12 DIAGNOSIS — O903 Peripartum cardiomyopathy: Secondary | ICD-10-CM | POA: Diagnosis present

## 2019-12-12 DIAGNOSIS — I50811 Acute right heart failure: Secondary | ICD-10-CM | POA: Diagnosis not present

## 2019-12-12 DIAGNOSIS — D509 Iron deficiency anemia, unspecified: Secondary | ICD-10-CM | POA: Diagnosis present

## 2019-12-12 DIAGNOSIS — I313 Pericardial effusion (noninflammatory): Secondary | ICD-10-CM | POA: Diagnosis present

## 2019-12-12 DIAGNOSIS — I509 Heart failure, unspecified: Secondary | ICD-10-CM

## 2019-12-12 LAB — BASIC METABOLIC PANEL
Anion gap: 10 (ref 5–15)
BUN: 16 mg/dL (ref 6–20)
CO2: 18 mmol/L — ABNORMAL LOW (ref 22–32)
Calcium: 8.1 mg/dL — ABNORMAL LOW (ref 8.9–10.3)
Chloride: 109 mmol/L (ref 98–111)
Creatinine, Ser: 0.81 mg/dL (ref 0.44–1.00)
GFR calc Af Amer: >60 mL/min (ref >60)
GFR calc non Af Amer: >60 mL/min (ref >60)
Glucose, Bld: 82 mg/dL (ref 70–99)
Potassium: 3.6 mmol/L (ref 3.5–5.1)
Sodium: 137 mmol/L (ref 135–145)

## 2019-12-12 LAB — CBC
HCT: 29.6 % — ABNORMAL LOW (ref 36.0–46.0)
Hemoglobin: 8.6 g/dL — ABNORMAL LOW (ref 12.0–15.0)
MCH: 24 pg — ABNORMAL LOW (ref 26.0–34.0)
MCHC: 29.1 g/dL — ABNORMAL LOW (ref 30.0–36.0)
MCV: 82.5 fL (ref 80.0–100.0)
Platelets: 270 10*3/uL (ref 150–400)
RBC: 3.59 MIL/uL — ABNORMAL LOW (ref 3.87–5.11)
RDW: 18.8 % — ABNORMAL HIGH (ref 11.5–15.5)
WBC: 7.9 10*3/uL (ref 4.0–10.5)
nRBC: 0.3 % — ABNORMAL HIGH (ref 0.0–0.2)

## 2019-12-12 LAB — ECHOCARDIOGRAM COMPLETE
Height: 64 in
Weight: 2640 oz

## 2019-12-12 LAB — TSH: TSH: 1.906 u[IU]/mL (ref 0.350–4.500)

## 2019-12-12 LAB — SARS CORONAVIRUS 2 (TAT 6-24 HRS): SARS Coronavirus 2: NEGATIVE

## 2019-12-12 LAB — TROPONIN I (HIGH SENSITIVITY): Troponin I (High Sensitivity): 23 ng/L — ABNORMAL HIGH (ref <18)

## 2019-12-12 MED ORDER — FUROSEMIDE 10 MG/ML IJ SOLN
40.0000 mg | Freq: Every day | INTRAMUSCULAR | Status: DC
  Administered 2019-12-12: 40 mg via INTRAVENOUS
  Filled 2019-12-12: qty 4

## 2019-12-12 MED ORDER — CARVEDILOL 25 MG PO TABS
25.0000 mg | ORAL_TABLET | Freq: Two times a day (BID) | ORAL | Status: DC
  Administered 2019-12-12 (×2): 25 mg via ORAL
  Filled 2019-12-12 (×3): qty 1

## 2019-12-12 MED ORDER — LOSARTAN POTASSIUM 50 MG PO TABS
50.0000 mg | ORAL_TABLET | Freq: Every day | ORAL | Status: DC
  Administered 2019-12-12 – 2019-12-14 (×3): 50 mg via ORAL
  Filled 2019-12-12 (×3): qty 1

## 2019-12-12 MED ORDER — POTASSIUM CHLORIDE CRYS ER 20 MEQ PO TBCR
40.0000 meq | EXTENDED_RELEASE_TABLET | Freq: Two times a day (BID) | ORAL | Status: DC
  Administered 2019-12-12 – 2019-12-13 (×4): 40 meq via ORAL
  Filled 2019-12-12 (×4): qty 2

## 2019-12-12 MED ORDER — HYDROCORTISONE (PERIANAL) 2.5 % EX CREA
TOPICAL_CREAM | Freq: Four times a day (QID) | CUTANEOUS | Status: DC
  Filled 2019-12-12: qty 28.35

## 2019-12-12 MED ORDER — FUROSEMIDE 10 MG/ML IJ SOLN: 80.0000 mg | Freq: Two times a day (BID) | INTRAMUSCULAR | Status: DC

## 2019-12-12 MED ORDER — FERROUS SULFATE 325 (65 FE) MG PO TABS
325.0000 mg | ORAL_TABLET | Freq: Every day | ORAL | Status: DC
  Administered 2019-12-12 – 2019-12-14 (×3): 325 mg via ORAL
  Filled 2019-12-12 (×3): qty 1

## 2019-12-12 MED ORDER — FUROSEMIDE 10 MG/ML IJ SOLN
40.0000 mg | Freq: Two times a day (BID) | INTRAMUSCULAR | Status: DC
  Administered 2019-12-12: 40 mg via INTRAVENOUS
  Filled 2019-12-12: qty 4

## 2019-12-12 MED ORDER — LABETALOL HCL 100 MG PO TABS
100.0000 mg | ORAL_TABLET | Freq: Two times a day (BID) | ORAL | Status: DC
  Administered 2019-12-12: 100 mg via ORAL
  Filled 2019-12-12 (×2): qty 1

## 2019-12-12 MED ORDER — ENOXAPARIN SODIUM 40 MG/0.4ML ~~LOC~~ SOLN
40.0000 mg | Freq: Every day | SUBCUTANEOUS | Status: DC
  Administered 2019-12-12 – 2019-12-14 (×3): 40 mg via SUBCUTANEOUS
  Filled 2019-12-12 (×3): qty 0.4

## 2019-12-12 MED ORDER — ACETAMINOPHEN 325 MG PO TABS
650.0000 mg | ORAL_TABLET | Freq: Four times a day (QID) | ORAL | Status: DC | PRN
  Administered 2019-12-12: 650 mg via ORAL
  Filled 2019-12-12: qty 2

## 2019-12-12 NOTE — H&P (Signed)
History and Physical    Roberta Guzman TWS:[id number] DOB: [birth year] DOA: 12/11/2019  PCP: Patient, No Pcp Per  Patient coming from: Home  I have personally briefly reviewed patient's old medical records in [facility]  Chief Complaint: Leg swelling  HPI: Roberta Guzman is a 29 y.o. female with medical history significant for Diet-controlled gestational diabetes, hypertension, iron deficiency anemia, hx of nephrolithiasis who presents with concerns of increase lower extremity edema.   Patient recently delivered her first child on 2/28.  Pregnancy was complicated by gestational diabetes that was diet-controlled.  She reports that later on she began to have issues with her blood pressure but was never formally diagnosed with preeclampsia.  She has since been placed on labetalol.  She also noted that she had some heavy bleeding during her vaginal delivery and has been placed on iron. Patient notes recently she has noticed a mild cough and yesterday she noticed significant bilateral lower extremity edema.  She also notes shortness of breath when walking long distances.  She denies any chest pain.  Patient has smoked for about 10 years and has cut down to 2 cigarettes/day.  Has occasional alcohol use but no illicit drug use.  Has significant history of congestive heart failure in mother and father who were diagnosed in their 51s.  Pt is not breast-feeding.   ED Course: She was afebrile, mildly tachycardic and normotensive on room air.  Lab work showed no leukocytosis and had hemoglobin of 8.1 which is down from her prior baseline of around 9.  Glucose of 100.  Mildly elevated total bilirubin of 1.8.  BNP significantly elevated at 1277.  Troponin of 21.  CTA chest showed no PE but had cardiomegaly with biatrial enlargement with reflux of contrast suggesting a right heart dysfunction.  Review of Systems:  Constitutional: No Weight Change, No Fever ENT/Mouth: No sore throat, No Rhinorrhea Eyes:  No Eye Pain, No Vision Changes Cardiovascular: No Chest Pain, + SOB, No PND, + Dyspnea on Exertion, No Orthopnea, + Edema, No Palpitations Respiratory: + Cough, No Sputum, Gastrointestinal: No Nausea, No Vomiting, No Diarrhea, No Constipation, No Pain Genitourinary: no Urinary Incontinence Musculoskeletal: No Arthralgias, No Myalgias Skin: No Skin Lesions, No Pruritus, Neuro: no Weakness, No Numbness Psych: No Anxiety/Panic, No Depression, no decrease appetite Heme/Lymph: No Bruising, No Bleeding  Past Medical History:  Diagnosis Date  . Gestational diabetes   . Kidney stones   . Pregnancy induced hypertension   . Renal disorder     Past Surgical History:  Procedure Laterality Date  . URETERAL STENT PLACEMENT       reports that she has been smoking cigarettes. She has been smoking about 0.50 packs per day. She has never used smokeless tobacco. She reports that she does not drink alcohol or use drugs.  No Known Allergies  Family History  Problem Relation Age of Onset  . Hypertension Mother   . Heart failure Mother   . Heart failure Father      Prior to Admission medications   Medication Sig Start Date End Date Taking? Authorizing Provider  acetaminophen (TYLENOL) 325 MG tablet Take 650 mg by mouth every 6 (six) hours as needed (pain.).   Yes [provider]  aspirin EC 81 MG tablet Take 1 tablet (81 mg total) by mouth daily. Take daily for preeclampsia prevention. 09/09/19  Yes Burleson, Terri L, NP  iron polysaccharides (NIFEREX) 150 MG capsule Take 1 capsule (150 mg total) by mouth daily. 09/09/19  Yes Burleson,  Terri L, NP  docusate calcium (SURFAK) 240 MG capsule Take 1 capsule (240 mg total) by mouth daily. Patient not taking: Reported on 12/11/2019 09/09/19   Currie Paris, NP  Prenat w/o A-FeCbn-Meth-FA-DHA (PRENATE MINI) 29-0.6-0.4-350 MG CAPS Take 1 capsule by mouth daily before breakfast. Patient not taking: Reported on 12/11/2019 08/17/19   Brock Bad, MD    Physical Exam: Vitals:   12/11/19 1900 12/11/19 2000 12/11/19 2045 12/11/19 2215  BP: (!) 138/111 (!) 123/98 (!) 132/98 (!) 127/106  Pulse: (!) 110 99 (!) 101 (!) 103  Resp: (!) 30 12 (!) 29 17  Temp:      TempSrc:      SpO2: 100% 100% 100% 99%  Weight:      Height:        Constitutional: NAD, calm, comfortable, well-appearing young female sitting in recliner chair Vitals:   12/11/19 1900 12/11/19 2000 12/11/19 2045 12/11/19 2215  BP: (!) 138/111 (!) 123/98 (!) 132/98 (!) 127/106  Pulse: (!) 110 99 (!) 101 (!) 103  Resp: (!) 30 12 (!) 29 17  Temp:      TempSrc:      SpO2: 100% 100% 100% 99%  Weight:      Height:       Eyes: PERRL, lids and conjunctivae normal ENMT: Mucous membranes are moist. Posterior pharynx clear of any exudate or lesions.Normal dentition.  Neck: normal, supple Respiratory: clear to auscultation bilaterally, no wheezing, no crackles. Normal respiratory effort on room air. No accessory muscle use.  Cardiovascular: Regular rate and rhythm, no murmurs / rubs / gallops.  Nonpitting edema of bilateral foot and +1 pitting edema of the mid pretibial region.  2+ pedal pulses.  Abdomen: no tenderness, no masses palpated.  Bowel sounds positive.  Musculoskeletal: no clubbing / cyanosis. No joint deformity upper and lower extremities. Good ROM, no contractures. Normal muscle tone.  Skin: no rashes, lesions, ulcers. No induration Neurologic: CN 2-12 grossly intact. Sensation intact. Strength 5/5 in all 4.  Psychiatric: Normal judgment and insight. Alert and oriented x 3. Normal mood.    Labs on Admission: I have personally reviewed following labs and imaging studies  CBC: Recent Labs  Lab 12/11/19 1944  WBC 7.5  NEUTROABS 4.4  HGB 8.1*  HCT 28.0*  MCV 83.3  PLT 259   Basic Metabolic Panel: Recent Labs  Lab 12/11/19 1944  NA 139  K 3.5  CL 108  CO2 23  GLUCOSE 100*  BUN 18  CREATININE 0.94  CALCIUM 8.3*   GFR: Estimated Creatinine  Clearance: 87.4 mL/min (by C-G formula based on SCr of 0.94 mg/dL). Liver Function Tests: Recent Labs  Lab 12/11/19 1944  AST 27  ALT 24  ALKPHOS 47  BILITOT 1.8*  PROT 5.9*  ALBUMIN 2.9*   No results for input(s): LIPASE, AMYLASE in the last 168 hours. No results for input(s): AMMONIA in the last 168 hours. Coagulation Profile: No results for input(s): INR, PROTIME in the last 168 hours. Cardiac Enzymes: No results for input(s): CKTOTAL, CKMB, CKMBINDEX, TROPONINI in the last 168 hours. BNP (last 3 results) No results for input(s): PROBNP in the last 8760 hours. HbA1C: No results for input(s): HGBA1C in the last 72 hours. CBG: No results for input(s): GLUCAP in the last 168 hours. Lipid Profile: No results for input(s): CHOL, HDL, LDLCALC, TRIG, CHOLHDL, LDLDIRECT in the last 72 hours. Thyroid Function Tests: No results for input(s): TSH, T4TOTAL, FREET4, T3FREE, THYROIDAB in the last 72 hours.  Anemia Panel: No results for input(s): VITAMINB12, FOLATE, FERRITIN, TIBC, IRON, RETICCTPCT in the last 72 hours. Urine analysis:    Component Value Date/Time   COLORURINE YELLOW 04/10/2014 1130   APPEARANCEUR Cloudy (A) 09/18/2019 1055   LABSPEC 1.018 04/10/2014 1130   PHURINE 7.0 04/10/2014 1130   GLUCOSEU Negative 09/18/2019 1055   HGBUR SMALL (A) 04/10/2014 1130   BILIRUBINUR Negative 09/18/2019 1055   KETONESUR NEGATIVE 04/10/2014 1130   PROTEINUR 1+ (A) 09/18/2019 1055   PROTEINUR NEGATIVE 04/10/2014 1130   UROBILINOGEN 1.0 04/10/2014 1130   NITRITE Negative 09/18/2019 1055   NITRITE NEGATIVE 04/10/2014 1130   LEUKOCYTESUR 1+ (A) 09/18/2019 1055    Radiological Exams on Admission: DG Chest 2 View  Result Date: 12/11/2019 CLINICAL DATA:  Shortness of breath, lower extremity edema, congestion, productive cough EXAM: CHEST - 2 VIEW COMPARISON:  None. FINDINGS: Frontal and lateral views of the chest demonstrate an enlarged cardiac silhouette. No airspace disease,  effusion, or pneumothorax. No acute bony abnormalities. IMPRESSION: 1. Enlarged cardiac silhouette. If pericardial effusion is a concern, echocardiography may be useful. 2. No acute airspace disease. Electronically Signed   By: Sharlet SalinaMichael  Brown M.D.   On: 12/11/2019 19:34   CT Angio Chest PE W and/or Wo Contrast  Result Date: 12/11/2019 CLINICAL DATA:  Shortness of breath, left leg and ankle swelling for several days with pitting edema, coughing clear mucus, preeclampsia and recent post partum EXAM: CT ANGIOGRAPHY CHEST WITH CONTRAST TECHNIQUE: Multidetector CT imaging of the chest was performed using the standard protocol during bolus administration of intravenous contrast. Multiplanar CT image reconstructions and MIPs were obtained to evaluate the vascular anatomy. CONTRAST:  80mL OMNIPAQUE IOHEXOL 350 MG/ML SOLN COMPARISON:  Chest radiograph 12/11/2018 FINDINGS: Cardiovascular: Satisfactory opacification the pulmonary arteries to the segmental level. No pulmonary artery filling defects are identified. Central pulmonary arteries are normal caliber. There is cardiomegaly with predominantly biatrial enlargement and notable reflux of the contrast bolus into the IVC and hepatic veins. Moderate pericardial effusion is present. The aorta is unopacified. Grossly normal aortic caliber. Mediastinum/Nodes: Increased attenuation of the mediastinum may reflect some edematous changes and or fluid in the pericardial recess, difficult to assess given the timing of the contrast bolus. Lungs/Pleura: There is a moderate right pleural effusion. Redistribution of the pulmonary vascularity is noted with some fissural and septal thickening. Atelectatic changes are present as well predominantly in the right lung base with more patchy consolidative basilar opacities which could reflect a combination of alveolar opacity and atelectasis. No pneumothorax. No left effusion. Left basilar subsegmental atelectasis is noted. Upper Abdomen:  Ascites present in the upper abdomen, low-attenuation (4 HU). Reflux of contrast into the hepatic veins, as above. No other acute abnormality in the upper abdomen. Musculoskeletal: No acute osseous abnormality or suspicious osseous lesion. No worrisome chest wall lesions. Mild circumferential body wall edema. Review of the MIP images confirms the above findings. IMPRESSION: 1. No acute pulmonary artery filling defects to suggest pulmonary embolism. 2. Cardiomegaly and biatrial enlargement with refluxing contrast to the IVC and hepatic veins, suggesting elevated right heart pressures/right-sided dysfunction. Additional third-spacing of fluid including pleural and pericardial effusions and ascites as well as body wall edema. Given the clinical setting, such findings are worrisome for a postpartum cardiomyopathy or preeclampsia related cardiomyopathy. 3. Some more focal opacity in the right lung base favor a combination of atelectasis and alveolar edema though certainly infection could have a similar appearance. These results were called by telephone at the time of interpretation  on 12/11/2019 at 10:46 pm to provider Veryl Speak , who verbally acknowledged these results. Electronically Signed   By: Lovena Le M.D.   On: 12/11/2019 22:46   VAS [place] LOWER EXTREMITY VENOUS (DVT) (MC and WL 7a-7p)  Result Date: 12/11/2019  Lower Venous DVTStudy Indications: Edema.  Comparison Study: no prior Performing Technologist: Abram Sander RVS  Examination Guidelines: A complete evaluation includes B-mode imaging, spectral Doppler, color Doppler, and power Doppler as needed of all accessible portions of each vessel. Bilateral testing is considered an integral part of a complete examination. Limited examinations for reoccurring indications may be performed as noted. The reflux portion of the exam is performed with the patient in reverse Trendelenburg.  +-----+---------------+---------+-----------+----------+--------------+  RIGHTCompressibilityPhasicitySpontaneityPropertiesThrombus Aging +-----+---------------+---------+-----------+----------+--------------+ CFV  Full           Yes      Yes                                 +-----+---------------+---------+-----------+----------+--------------+   +---------+---------------+---------+-----------+----------+--------------+ LEFT     CompressibilityPhasicitySpontaneityPropertiesThrombus Aging +---------+---------------+---------+-----------+----------+--------------+ CFV      Full           Yes      Yes                                 +---------+---------------+---------+-----------+----------+--------------+ SFJ      Full                                                        +---------+---------------+---------+-----------+----------+--------------+ FV Prox  Full                                                        +---------+---------------+---------+-----------+----------+--------------+ FV Mid   Full                                                        +---------+---------------+---------+-----------+----------+--------------+ FV DistalFull                                                        +---------+---------------+---------+-----------+----------+--------------+ PFV      Full                                                        +---------+---------------+---------+-----------+----------+--------------+ POP      Full           Yes      Yes                                 +---------+---------------+---------+-----------+----------+--------------+  PTV      Full                                                        +---------+---------------+---------+-----------+----------+--------------+ PERO     Full                                                        +---------+---------------+---------+-----------+----------+--------------+     Summary: RIGHT: - No evidence of common femoral vein  obstruction.  LEFT: - There is no evidence of deep vein thrombosis in the lower extremity.  - No cystic structure found in the popliteal fossa.  *See table(s) above for measurements and observations.    Preliminary     EKG: Independently reviewed.   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  Continue to trend.  Hypertension Continue labetalol twice daily  Iron deficiency anemia Continue iron supplementation  Tobacco use Counseled cessation  DVT prophylaxis:.Lovenox Code Status: Full Family Communication: Plan discussed with patient at bedside  disposition Plan: Home with at least 2 midnight stays  Consults called:  Admission status: inpatient   Roberta Guzman T Mercedes Fort DO [facility]   If 7PM-7AM, please contact night-coverage [email or web address]   12/12/2019, 12:02 AM

## 2019-12-12 NOTE — Progress Notes (Signed)
Care started prior to midnight in the emergency room and patient was admitted early this morning by my partner and colleague Dr. Benita Gutter and I am in current agreement with her assessment and plan.  Additional changes to the plan of care have been made accordingly.  The patient is a 29 year old female with past medical history significant for but not limited to diet-controlled gestational diabetes, hypertension, iron deficiency anemia, history of nephrolithiasis as well as other comorbidities who presents with a chief complaint of increasing lower extremity edema.  Patient recently delivered her first child in February and pregnancy was complicated by gestational diabetes.  She later reported that she began to have issues with blood pressure and was never formally diagnosed with preeclampsia.  Since then she has been placed on labetalol as noted some heavy bleeding during her vaginal delivery and was placed on iron.  She recently started noticing a mild cough and had noticed significant bilateral lower extremity edema.  She also complains of some shortness of breath when long walks and she denies any chest pain.  She smoked for about 10 years and cut down to 2 cigarettes a day and denies any alcohol or illicit drug use.  In the emergency room CT of the chest was done which showed no PE but did have cardiomegaly with biatrial enlargement with reflux of contrast suggesting right heart dysfunction.  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  A TSH is also being checked and the patient is being diuresed with IV Lasix 40 mg Daily.  We will continue to monitor strict I's and O's and will consult cardiology for further evaluation.  She did have some elevated troponins but this likely demand ischemia from her new onset CHF.  Will follow up on cardiology recommendations and appreciate their assistance in  management and evaluation.  We will continue to monitor patient's clinical response to intervention, monitor her clinical course, repeat blood work in the a.m, and follow-up on Cardiology recommendations.

## 2019-12-12 NOTE — Progress Notes (Signed)
  Echocardiogram 2D Echocardiogram has been performed.  Roberta Guzman A Glennice Marcos 12/12/2019, 8:44 AM

## 2019-12-12 NOTE — Progress Notes (Signed)
Patient states that she feels like she is "floating".  Patient had Carvedilol @ 1736. PCP was notified

## 2019-12-12 NOTE — Progress Notes (Signed)
   12/12/19 2034  Vitals  Temp 97.7 F (36.5 C)  Temp Source Oral  BP 97/76  MAP (mmHg) 84  BP Location Right Arm  BP Method Automatic  Patient Position (if appropriate) Lying  Pulse Rate 94  Resp 20  Oxygen Therapy  SpO2 100 %  O2 Device Room Air  MEWS Score  MEWS Temp 0  MEWS Systolic 1  MEWS Pulse 0  MEWS RR 0  MEWS LOC 0  MEWS Score 1  MEWS Score Color Green

## 2019-12-12 NOTE — Progress Notes (Signed)
Pt arrived from ED on stretcher. Ambulated independently to room and bed. VSS. No c/o pain/discomfort. Pt oriented to callbell and environment. POC discussed. Will monitor.

## 2019-12-12 NOTE — Consult Note (Signed)
Cardiology Consultation:   Patient ID: Roberta Guzman MRN: [id number]; DOB: 1990/10/13  Admit date: 12/11/2019 Date of Consult: 12/12/2019  Primary Care Provider: Patient, No Pcp Per Primary Cardiologist: Reatha Harps, MD  Primary Electrophysiologist:  None    Patient Profile:   Roberta Guzman is a 29 y.o. 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 Marland Mcalpine.  History of Present Illness:   Roberta Guzman has not been seen by cardiology in the past. No history of MI, stent, arrhythmia, palpitations, HLD. Patient has smoked for 10 years and has cut down to 2 cigarettes daily. She drinks alcohol occasionally. Denies drug use. Family history positive for CHF in mother and father in their 55s.   The patient delivered her first child 2/27. Pregnancy was complicated by diet controlled gestational diabetes and hypertension on labetolol. Prior to pregnancy she did not have high blood pressure. She came to the ED for sob and lower leg edema. She reported 2 days of lower leg swelling and shortness of breath worse on exertion. She was having to stop every couple steps to catch her breath. She has trouble lying flat as well. No chest pain. She is not breastfeeding. No recent fever, chills, N/V, diarrhea.  In the ED the patient was afebrile and tachycardic with pulse at 110. BP 138/111. Labs showed potassium 3.5, creatinine 0.94, glucose 100, Hgb 8.1 (on supplemental iron). HS troponin 21. BNP 1377. Total bili at 1.8.  EKG showed sinus tachycardia, 108 bpm, nonspecific T wave changes anterolateral leads and possible LAE. CXR with enlarged cardiac silhouette, possible pericardial effusion. CTA showed no PE but was positive for cardiomegaly with biatrial enlargement with reflux contrast suggesting right heart dysfunction. The patient was admitted.    Past Medical  History:  Diagnosis Date  . Gestational diabetes   . Kidney stones   . Pregnancy induced hypertension   . Renal disorder     Past Surgical History:  Procedure Laterality Date  . URETERAL STENT PLACEMENT       Home Medications:  Prior to Admission medications   Medication Sig Start Date End Date Taking? Authorizing Provider  acetaminophen (TYLENOL) 325 MG tablet Take 650 mg by mouth every 6 (six) hours as needed (pain.).   Yes [provider]  aspirin EC 81 MG tablet Take 1 tablet (81 mg total) by mouth daily. Take daily for preeclampsia prevention. 09/09/19  Yes Burleson, Terri L, NP  iron polysaccharides (NIFEREX) 150 MG capsule Take 1 capsule (150 mg total) by mouth daily. 09/09/19  Yes Burleson, Terri L, NP  docusate calcium (SURFAK) 240 MG capsule Take 1 capsule (240 mg total) by mouth daily. Patient not taking: Reported on 12/11/2019 09/09/19   Currie Paris, NP  Prenat w/o A-FeCbn-Meth-FA-DHA (PRENATE MINI) 29-0.6-0.4-350 MG CAPS Take 1 capsule by mouth daily before breakfast. Patient not taking: Reported on 12/11/2019 08/17/19   Brock Bad, MD    Inpatient Medications: Scheduled Meds: . enoxaparin (LOVENOX) injection  40 mg Subcutaneous Daily  . ferrous sulfate  325 mg Oral Q breakfast  . furosemide  40 mg Intravenous Daily  . hydrocortisone   Rectal QID  . labetalol  100 mg Oral BID   Continuous Infusions:  PRN Meds: acetaminophen  Allergies:   No Known Allergies  Social History:   Social History   Socioeconomic History  . Marital status: Single  Spouse name: Not on file  . Number of children: Not on file  . Years of education: Not on file  . Highest education level: Not on file  Occupational History  . Not on file  Tobacco Use  . Smoking status: Current Every Day Smoker    Packs/day: 0.50    Types: Cigarettes  . Smokeless tobacco: Never Used  Substance and Sexual Activity  . Alcohol use: No  . Drug use: No  . Sexual activity: Yes     Birth control/protection: None  Other Topics Concern  . Not on file  Social History Narrative  . Not on file   Social Determinants of Health   Financial Resource Strain:   . Difficulty of Paying Living Expenses:   Food Insecurity:   . Worried About Programme researcher, broadcasting/film/videounning Out of Food in the Last Year:   . Baristaan Out of Food in the Last Year:   Transportation Needs:   . Freight forwarderLack of Transportation (Medical):   Marland Kitchen. Lack of Transportation (Non-Medical):   Physical Activity:   . Days of Exercise per Week:   . Minutes of Exercise per Session:   Stress:   . Feeling of Stress :   Social Connections:   . Frequency of Communication with Friends and Family:   . Frequency of Social Gatherings with Friends and Family:   . Attends Religious Services:   . Active Member of Clubs or Organizations:   . Attends BankerClub or Organization Meetings:   Marland Kitchen. Marital Status:   Intimate Partner Violence:   . Fear of Current or Ex-Partner:   . Emotionally Abused:   Marland Kitchen. Physically Abused:   . Sexually Abused:     Family History:   Family History  Problem Relation Age of Onset  . Hypertension Mother   . Heart failure Mother   . Heart failure Father      ROS:  Please see the history of present illness.  All other ROS reviewed and negative.     Physical Exam/Data:   Vitals:   12/12/19 0900 12/12/19 0930 12/12/19 0945 12/12/19 1000  BP: (!) 135/107 (!) 140/102  (!) 146/109  Pulse: 91 84 95 89  Resp: 15 (!) 21 (!) 32 17  Temp:      TempSrc:      SpO2: 100% 100% 100% 100%  Weight:      Height:       No intake or output data in the 24 hours ending 12/12/19 1009 Last 3 Weights 12/11/2019 09/18/2019 09/07/2019  Weight (lbs) 165 lb 180 lb 179 lb  Weight (kg) 74.844 kg 81.647 kg 81.194 kg     Body mass index is 28.32 kg/m.  General:  Well nourished, well developed, in no acute distress HEENT: normal Lymph: no adenopathy Neck: + JVD Endocrine:  No thryomegaly Vascular: No carotid bruits; FA pulses 2+ bilaterally without  bruits  Cardiac:  normal S1, S2; RRR; + murmur  Lungs:  clear to auscultation bilaterally, no wheezing, rhonchi or rales  Abd: soft, nontender, no hepatomegaly  Ext: mild edema Musculoskeletal:  No deformities, BUE and BLE strength normal and equal Skin: warm and dry  Neuro:  CNs 2-12 intact, no focal abnormalities noted Psych:  Normal affect   EKG:  The EKG was personally reviewed and demonstrates:  Sinus tachycardia, 108 bpm, nonspecific T wave abnormality anterolateral leads, possible LAE Telemetry:  Telemetry was personally reviewed and demonstrates:  NSR, HR 80-90s, PVCs  Relevant CV Studies:  Echo results pending  Laboratory Data:  High Sensitivity Troponin:   Recent Labs  Lab 12/11/19 1944 12/12/19 0350  TROPONINIHS 21* 23*     Chemistry Recent Labs  Lab 12/11/19 1944 12/12/19 0350  NA 139 137  K 3.5 3.6  CL 108 109  CO2 23 18*  GLUCOSE 100* 82  BUN 18 16  CREATININE 0.94 0.81  CALCIUM 8.3* 8.1*  GFRNONAA >60 >60  GFRAA >60 >60  ANIONGAP 8 10    Recent Labs  Lab 12/11/19 1944  PROT 5.9*  ALBUMIN 2.9*  AST 27  ALT 24  ALKPHOS 47  BILITOT 1.8*   Hematology Recent Labs  Lab 12/11/19 1944 12/12/19 0350  WBC 7.5 7.9  RBC 3.36* 3.59*  HGB 8.1* 8.6*  HCT 28.0* 29.6*  MCV 83.3 82.5  MCH 24.1* 24.0*  MCHC 28.9* 29.1*  RDW 18.6* 18.8*  PLT 259 270   BNP Recent Labs  Lab 12/11/19 1944  BNP 1,377.9*    DDimer  Recent Labs  Lab 12/11/19 1944  DDIMER 4.67*     Radiology/Studies:  DG Chest 2 View  Result Date: 12/11/2019 CLINICAL DATA:  Shortness of breath, lower extremity edema, congestion, productive cough EXAM: CHEST - 2 VIEW COMPARISON:  None. FINDINGS: Frontal and lateral views of the chest demonstrate an enlarged cardiac silhouette. No airspace disease, effusion, or pneumothorax. No acute bony abnormalities. IMPRESSION: 1. Enlarged cardiac silhouette. If pericardial effusion is a concern, echocardiography may be useful. 2. No acute  airspace disease. Electronically Signed   By: Sharlet Salina M.D.   On: 12/11/2019 19:34   CT Angio Chest PE W and/or Wo Contrast  Result Date: 12/11/2019 CLINICAL DATA:  Shortness of breath, left leg and ankle swelling for several days with pitting edema, coughing clear mucus, preeclampsia and recent post partum EXAM: CT ANGIOGRAPHY CHEST WITH CONTRAST TECHNIQUE: Multidetector CT imaging of the chest was performed using the standard protocol during bolus administration of intravenous contrast. Multiplanar CT image reconstructions and MIPs were obtained to evaluate the vascular anatomy. CONTRAST:  46mL OMNIPAQUE IOHEXOL 350 MG/ML SOLN COMPARISON:  Chest radiograph 12/11/2018 FINDINGS: Cardiovascular: Satisfactory opacification the pulmonary arteries to the segmental level. No pulmonary artery filling defects are identified. Central pulmonary arteries are normal caliber. There is cardiomegaly with predominantly biatrial enlargement and notable reflux of the contrast bolus into the IVC and hepatic veins. Moderate pericardial effusion is present. The aorta is unopacified. Grossly normal aortic caliber. Mediastinum/Nodes: Increased attenuation of the mediastinum may reflect some edematous changes and or fluid in the pericardial recess, difficult to assess given the timing of the contrast bolus. Lungs/Pleura: There is a moderate right pleural effusion. Redistribution of the pulmonary vascularity is noted with some fissural and septal thickening. Atelectatic changes are present as well predominantly in the right lung base with more patchy consolidative basilar opacities which could reflect a combination of alveolar opacity and atelectasis. No pneumothorax. No left effusion. Left basilar subsegmental atelectasis is noted. Upper Abdomen: Ascites present in the upper abdomen, low-attenuation (4 HU). Reflux of contrast into the hepatic veins, as above. No other acute abnormality in the upper abdomen. Musculoskeletal: No  acute osseous abnormality or suspicious osseous lesion. No worrisome chest wall lesions. Mild circumferential body wall edema. Review of the MIP images confirms the above findings. IMPRESSION: 1. No acute pulmonary artery filling defects to suggest pulmonary embolism. 2. Cardiomegaly and biatrial enlargement with refluxing contrast to the IVC and hepatic veins, suggesting elevated right heart pressures/right-sided dysfunction. Additional third-spacing of fluid including pleural and pericardial effusions and  ascites as well as body wall edema. Given the clinical setting, such findings are worrisome for a postpartum cardiomyopathy or preeclampsia related cardiomyopathy. 3. Some more focal opacity in the right lung base favor a combination of atelectasis and alveolar edema though certainly infection could have a similar appearance. These results were called by telephone at the time of interpretation on 12/11/2019 at 10:46 pm to provider Veryl Speak , who verbally acknowledged these results. Electronically Signed   By: Lovena Le M.D.   On: 12/11/2019 22:46   VAS [place] LOWER EXTREMITY VENOUS (DVT) (MC and WL 7a-7p)  Result Date: 12/11/2019  Lower Venous DVTStudy Indications: Edema.  Comparison Study: no prior Performing Technologist: Abram Sander RVS  Examination Guidelines: A complete evaluation includes B-mode imaging, spectral Doppler, color Doppler, and power Doppler as needed of all accessible portions of each vessel. Bilateral testing is considered an integral part of a complete examination. Limited examinations for reoccurring indications may be performed as noted. The reflux portion of the exam is performed with the patient in reverse Trendelenburg.  +-----+---------------+---------+-----------+----------+--------------+ RIGHTCompressibilityPhasicitySpontaneityPropertiesThrombus Aging +-----+---------------+---------+-----------+----------+--------------+ CFV  Full           Yes      Yes                                  +-----+---------------+---------+-----------+----------+--------------+   +---------+---------------+---------+-----------+----------+--------------+ LEFT     CompressibilityPhasicitySpontaneityPropertiesThrombus Aging +---------+---------------+---------+-----------+----------+--------------+ CFV      Full           Yes      Yes                                 +---------+---------------+---------+-----------+----------+--------------+ SFJ      Full                                                        +---------+---------------+---------+-----------+----------+--------------+ FV Prox  Full                                                        +---------+---------------+---------+-----------+----------+--------------+ FV Mid   Full                                                        +---------+---------------+---------+-----------+----------+--------------+ FV DistalFull                                                        +---------+---------------+---------+-----------+----------+--------------+ PFV      Full                                                        +---------+---------------+---------+-----------+----------+--------------+  POP      Full           Yes      Yes                                 +---------+---------------+---------+-----------+----------+--------------+ PTV      Full                                                        +---------+---------------+---------+-----------+----------+--------------+ PERO     Full                                                        +---------+---------------+---------+-----------+----------+--------------+     Summary: RIGHT: - No evidence of common femoral vein obstruction.  LEFT: - There is no evidence of deep vein thrombosis in the lower extremity.  - No cystic structure found in the popliteal fossa.  *See table(s) above for measurements and  observations.    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 BNP elevated to 1,377. CXR with enlarged cardiac silhouette, possible pericardial effusion. CTA chest negative for PE; with cardiomegaly, elevated RH pressures, pleural and pericardial effusions likely postpartum CM - Echo performed - started on IV lasix 40 mg daily - I/Os and weights have not been completed>>wil order - creatinine stable - continue with diuresis. Can consider increasing lasix to BID - possible medication changes pending echo results - MD to see  Elevated troponin - very minimally elevated to 21>23 - likely demand ischemia - will risk stratify with lipid panel and A1C  Hypertension - labetolol 100mg  BID - pressures slightly up   For questions or updates, please contact [facility] Please consult [email or web address] for contact info under     Signed, Rad Gramling , PA-C  12/12/2019 10:09 AM

## 2019-12-12 NOTE — Progress Notes (Signed)
Patient has been wearing a mask in her room all afternoon, so RN asked patient to take off the mask to see if that improves her feeling like she is "floating".

## 2019-12-12 NOTE — ED Notes (Signed)
Pt ambulatory to RR 

## 2019-12-13 ENCOUNTER — Inpatient Hospital Stay (HOSPITAL_COMMUNITY): Payer: Medicaid - Out of State

## 2019-12-13 DIAGNOSIS — F17200 Nicotine dependence, unspecified, uncomplicated: Secondary | ICD-10-CM | POA: Diagnosis not present

## 2019-12-13 DIAGNOSIS — O903 Peripartum cardiomyopathy: Secondary | ICD-10-CM

## 2019-12-13 DIAGNOSIS — I50811 Acute right heart failure: Secondary | ICD-10-CM | POA: Diagnosis not present

## 2019-12-13 DIAGNOSIS — R778 Other specified abnormalities of plasma proteins: Secondary | ICD-10-CM | POA: Diagnosis not present

## 2019-12-13 DIAGNOSIS — I5041 Acute combined systolic (congestive) and diastolic (congestive) heart failure: Secondary | ICD-10-CM | POA: Diagnosis not present

## 2019-12-13 LAB — CBC WITH DIFFERENTIAL/PLATELET
Abs Immature Granulocytes: 0.03 10*3/uL (ref 0.00–0.07)
Basophils Absolute: 0 10*3/uL (ref 0.0–0.1)
Basophils Relative: 0 %
Eosinophils Absolute: 0.1 10*3/uL (ref 0.0–0.5)
Eosinophils Relative: 2 %
HCT: 26.6 % — ABNORMAL LOW (ref 36.0–46.0)
Hemoglobin: 7.8 g/dL — ABNORMAL LOW (ref 12.0–15.0)
Immature Granulocytes: 0 %
Lymphocytes Relative: 28 %
Lymphs Abs: 1.9 10*3/uL (ref 0.7–4.0)
MCH: 23.9 pg — ABNORMAL LOW (ref 26.0–34.0)
MCHC: 29.3 g/dL — ABNORMAL LOW (ref 30.0–36.0)
MCV: 81.6 fL (ref 80.0–100.0)
Monocytes Absolute: 0.6 10*3/uL (ref 0.1–1.0)
Monocytes Relative: 9 %
Neutro Abs: 4.1 10*3/uL (ref 1.7–7.7)
Neutrophils Relative %: 61 %
Platelets: 259 10*3/uL (ref 150–400)
RBC: 3.26 MIL/uL — ABNORMAL LOW (ref 3.87–5.11)
RDW: 18.6 % — ABNORMAL HIGH (ref 11.5–15.5)
WBC: 6.8 10*3/uL (ref 4.0–10.5)
nRBC: 0.3 % — ABNORMAL HIGH (ref 0.0–0.2)

## 2019-12-13 LAB — GLUCOSE, CAPILLARY
Glucose-Capillary: 120 mg/dL — ABNORMAL HIGH (ref 70–99)
Glucose-Capillary: 110 mg/dL — ABNORMAL HIGH (ref 70–99)

## 2019-12-13 LAB — COMPREHENSIVE METABOLIC PANEL
ALT: 19 U/L (ref 0–44)
AST: 21 U/L (ref 15–41)
Albumin: 2.7 g/dL — ABNORMAL LOW (ref 3.5–5.0)
Alkaline Phosphatase: 40 U/L (ref 38–126)
Anion gap: 8 (ref 5–15)
BUN: 20 mg/dL (ref 6–20)
CO2: 22 mmol/L (ref 22–32)
Calcium: 8 mg/dL — ABNORMAL LOW (ref 8.9–10.3)
Chloride: 107 mmol/L (ref 98–111)
Creatinine, Ser: 0.91 mg/dL (ref 0.44–1.00)
GFR calc Af Amer: >60 mL/min (ref >60)
GFR calc non Af Amer: >60 mL/min (ref >60)
Glucose, Bld: 114 mg/dL — ABNORMAL HIGH (ref 70–99)
Potassium: 4.1 mmol/L (ref 3.5–5.1)
Sodium: 137 mmol/L (ref 135–145)
Total Bilirubin: 1.6 mg/dL — ABNORMAL HIGH (ref 0.3–1.2)
Total Protein: 5.5 g/dL — ABNORMAL LOW (ref 6.5–8.1)

## 2019-12-13 LAB — MAGNESIUM: Magnesium: 1.8 mg/dL (ref 1.7–2.4)

## 2019-12-13 LAB — LIPID PANEL
Cholesterol: 67 mg/dL (ref 0–200)
HDL: <10 mg/dL — ABNORMAL LOW (ref >40)
Triglycerides: 51 mg/dL (ref <150)
VLDL: 10 mg/dL (ref 0–40)

## 2019-12-13 LAB — LACTATE DEHYDROGENASE: LDH: 140 U/L (ref 98–192)

## 2019-12-13 LAB — PHOSPHORUS: Phosphorus: 3.1 mg/dL (ref 2.5–4.6)

## 2019-12-13 LAB — HEMOGLOBIN A1C
Hgb A1c MFr Bld: 5.4 % (ref 4.8–5.6)
Mean Plasma Glucose: 108 mg/dL

## 2019-12-13 MED ORDER — FUROSEMIDE 10 MG/ML IJ SOLN
40.0000 mg | Freq: Once | INTRAMUSCULAR | Status: AC
  Administered 2019-12-13: 40 mg via INTRAVENOUS
  Filled 2019-12-13: qty 4

## 2019-12-13 MED ORDER — MAGNESIUM SULFATE 2 GM/50ML IV SOLN
2.0000 g | Freq: Once | INTRAVENOUS | Status: AC
  Administered 2019-12-13: 2 g via INTRAVENOUS
  Filled 2019-12-13: qty 50

## 2019-12-13 MED ORDER — CARVEDILOL 12.5 MG PO TABS: 12.5000 mg | ORAL_TABLET | Freq: Two times a day (BID) | ORAL | Status: DC

## 2019-12-13 MED ORDER — SPIRONOLACTONE 12.5 MG HALF TABLET
12.5000 mg | ORAL_TABLET | Freq: Every day | ORAL | Status: DC
  Administered 2019-12-13 – 2019-12-14 (×2): 12.5 mg via ORAL
  Filled 2019-12-13 (×2): qty 1

## 2019-12-13 MED ORDER — CARVEDILOL 12.5 MG PO TABS
12.5000 mg | ORAL_TABLET | Freq: Two times a day (BID) | ORAL | Status: DC
  Administered 2019-12-13 – 2019-12-14 (×3): 12.5 mg via ORAL
  Filled 2019-12-13 (×3): qty 1

## 2019-12-13 NOTE — Progress Notes (Signed)
Vitals: 97.69F;HR91;RR16;91/69;100 % room air. Patient still c/o feeling weak and having a feeling like she will pass out. No c/o chest pain. Patient was asked to call for assistance when she wants to get out of bed. PCP was notified. As per order the CBG was checked,it was 120. Will continue to monitor the patient,

## 2019-12-13 NOTE — Plan of Care (Signed)
  Problem: Education: Goal: Ability to demonstrate management of disease process will improve Outcome: Progressing Goal: Ability to verbalize understanding of medication therapies will improve Outcome: Progressing   Problem: Activity: Goal: Capacity to carry out activities will improve Outcome: Progressing   Problem: Cardiac: Goal: Ability to achieve and maintain adequate cardiopulmonary perfusion will improve Outcome: Progressing   Problem: Education: Goal: Knowledge of General Education information will improve Description: Including pain rating scale, medication(s)/side effects and non-pharmacologic comfort measures Outcome: Progressing   Problem: Health Behavior/Discharge Planning: Goal: Ability to manage health-related needs will improve Outcome: Progressing   Problem: Clinical Measurements: Goal: Ability to maintain clinical measurements within normal limits will improve Outcome: Progressing Goal: Diagnostic test results will improve Outcome: Progressing Goal: Respiratory complications will improve Outcome: Progressing Note: Pt reports DOE improved, still with orthopnea Goal: Cardiovascular complication will be avoided Outcome: Progressing   Problem: Activity: Goal: Risk for activity intolerance will decrease Outcome: Progressing

## 2019-12-13 NOTE — Progress Notes (Signed)
PROGRESS NOTE    Roberta Guzman  ZOX:[id number] DOB: 11/03/1990 DOA: 12/11/2019 PCP: Patient, No Pcp Per    Chief Complaint  Patient presents with  . Leg Swelling    Brief Narrative:  HPI per Dr. Wilhemena Durie is a 29 y.o. female with medical history significant for Diet-controlled gestational diabetes, hypertension, iron deficiency anemia, hx of nephrolithiasis who presents with concerns of increase lower extremity edema.   Patient recently delivered her first child on 2/28.  Pregnancy was complicated by gestational diabetes that was diet-controlled.  She reports that later on she began to have issues with her blood pressure but was never formally diagnosed with preeclampsia.  She has since been placed on labetalol.  She also noted that she had some heavy bleeding during her vaginal delivery and has been placed on iron. Patient notes recently she has noticed a mild cough and yesterday she noticed significant bilateral lower extremity edema.  She also notes shortness of breath when walking long distances.  She denies any chest pain.  Patient has smoked for about 10 years and has cut down to 2 cigarettes/day.  Has occasional alcohol use but no illicit drug use.  Has significant history of congestive heart failure in mother and father who were diagnosed in their 80s.  Pt is not breast-feeding.   ED Course: She was afebrile, mildly tachycardic and normotensive on room air.  Lab work showed no leukocytosis and had hemoglobin of 8.1 which is down from her prior baseline of around 9.  Glucose of 100.  Mildly elevated total bilirubin of 1.8.  BNP significantly elevated at 1277.  Troponin of 21.  CTA chest showed no PE but had cardiomegaly with biatrial enlargement with reflux of contrast suggesting a right heart dysfunction.   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  Patient with first child October 29, 2019 which was complicated by gestational diabetes which was diet-controlled.  Patient also noted to have issues with blood pressure.  On admission BNP significantly elevated at 1277.  Troponins minimally elevated and flattened.  CT chest negative for PE but cardiomegaly and biatrial enlargement noted with reflux of contrast to the IVC and hepatic veins suggesting elevated right heart pressures/right-sided dysfunction.  2D echo done with a EF of 25 to 30% with left ventricular global hypokinesis, grade 3 diastolic dysfunction, right ventricular systolic function moderately reduced, moderately elevated pulmonary artery systolic pressures, left atrial size severely dilated, right atrial size severely dilated, moderate tricuspid valvular regurgitation, mild mitral valve regurgitation.  Patient seen by cardiology placed on IV Lasix with a urine output of 2.5 L over the past 24 hours.  Patient is -3.2 L during this hospitalization.  Of 158.73 pounds from 165 pounds on admission.  Patient improving clinically.  Patient seen in consultation by cardiology and patient per cardiology has been diuresed adequately.  IV Lasix has been discontinued.  Continue current regimen of Coreg, losartan, Aldactone.  Per cardiology can start Lasix at 40 mg daily as needed tomorrow.  DC scheduled potassium supplementation.  Cardiology has advised patient against further pregnancy further unforeseeable future.  Patient not breast-feeding.  Appreciate cardiology's input and recommendations.  2.  Iron deficiency anemia Patient denies any overt bleeding.  Hemoglobin at 7.8.  To new oral iron supplementation.  Transfusion threshold hemoglobin  less than 7.  Outpatient follow-up.  3.  Hypertension Continue current regimen of Coreg, Cozaar, Aldactone.  4.  Elevated troponin Likely demand ischemia secondary to problem #1.  Per cardiology.  5.  Tobacco abuse Tobacco cessation.  DVT prophylaxis: Lovenox Code Status: Full Family Communication: Updated patient.  No family at bedside. Disposition:   Status is: Inpatient    Dispo: The patient is from: Home              Anticipated d/c is to: Home              Anticipated d/c date is: 12/14/2019              Patient currently improving clinically.  If continued improvement hopefully discharge tomorrow 12/14/2019        Consultants:   Cardiology: Dr. Carmon Ginsberg 12/12/2019  Procedures:   2D echo 12/12/2019  Lower extremity Dopplers 12/11/2019  Chest x-ray 12/11/2019, 12/13/2019  CT angiogram chest 12/11/2019    Antimicrobials:   None   Subjective: Patient laying in bed at 45 degrees angle.  States shortness of breath has improved since admission.  States lower extremity edema improving.  States unable to lay flat as she tried last night but however got short of breath.  Denies any chest pain.  Does state that she is not breast-feeding at this time and does not plan on.  Objective: Vitals:   12/13/19 0832 12/13/19 1124 12/13/19 1343 12/13/19 1803  BP: 103/81 98/72 106/82 (!) 96/48  Pulse: 90 90 93 93  Resp: 20  20   Temp: 98.2 F (36.8 C)  98.5 F (36.9 C)   TempSrc: Oral  Oral   SpO2: 100%  100%   Weight:      Height:        Intake/Output Summary (Last 24 hours) at 12/13/2019 2142 Last data filed at 12/13/2019 1009 Gross per 24 hour  Intake 600 ml  Output 3100 ml  Net -2500 ml   Filed Weights   12/11/19 1825 12/13/19 0500  Weight: 74.8 kg 72 kg    Examination:  General exam: Appears calm and comfortable  Respiratory system: Clear to auscultation. Respiratory effort normal. Cardiovascular system: S1 & S2 heard, RRR. No JVD, murmurs, rubs, gallops  or clicks. No pedal edema. Gastrointestinal system: Abdomen is nondistended, soft and nontender. No organomegaly or masses felt. Normal bowel sounds heard. Central nervous system: Alert and oriented. No focal neurological deficits. Extremities: Symmetric 5 x 5 power. Skin: No rashes, lesions or ulcers Psychiatry: Judgement and insight appear normal. Mood & affect appropriate.     Data Reviewed: I have personally reviewed following labs and imaging studies  CBC: Recent Labs  Lab 12/11/19 1944 12/12/19 0350 12/13/19 0426  WBC 7.5 7.9 6.8  NEUTROABS 4.4  --  4.1  HGB 8.1* 8.6* 7.8*  HCT 28.0* 29.6* 26.6*  MCV 83.3 82.5 81.6  PLT 259 270 259    Basic Metabolic Panel: Recent Labs  Lab 12/11/19 1944 12/12/19 0350 12/13/19 0426  NA 139 137 137  K 3.5 3.6 4.1  CL 108 109 107  CO2 23 18* 22  GLUCOSE 100* 82 114*  BUN 18 16 20   CREATININE 0.94 0.81 0.91  CALCIUM 8.3* 8.1* 8.0*  MG  --   --  1.8  PHOS  --   --  3.1    GFR: Estimated Creatinine Clearance: 88.7 mL/min (by C-G formula based on SCr of 0.91 mg/dL).  Liver Function Tests: Recent Labs  Lab 12/11/19 1944 12/13/19 0426  AST 27 21  ALT 24 19  ALKPHOS 47 40  BILITOT 1.8* 1.6*  PROT 5.9* 5.5*  ALBUMIN 2.9* 2.7*    CBG: Recent Labs  Lab 12/13/19 0209 12/13/19 0748  GLUCAP 120* 110*     Recent Results (from the past 240 hour(s))  SARS CORONAVIRUS 2 (TAT 6-24 HRS) Nasopharyngeal Nasopharyngeal Swab     Status: None   Collection Time: 12/12/19  1:53 AM   Specimen: Nasopharyngeal Swab  Result Value Ref Range Status   SARS Coronavirus 2 NEGATIVE NEGATIVE Final    Comment: (NOTE) SARS-CoV-2 target nucleic acids are NOT DETECTED. The SARS-CoV-2 RNA is generally detectable in upper and lower respiratory specimens during the acute phase of infection. Negative results do not preclude SARS-CoV-2 infection, do not rule out co-infections with other pathogens, and should not be used as the sole basis for  treatment or other patient management decisions. Negative results must be combined with clinical observations, patient history, and epidemiological information. The expected result is Negative. Fact Sheet for Patients: SugarRoll.be Fact Sheet for Healthcare Providers: https://www.woods-mathews.com/ This test is not yet approved or cleared by the [place] FDA and  has been authorized for detection and/or diagnosis of SARS-CoV-2 by FDA under an Emergency Use Authorization (EUA). This EUA will remain  in effect (meaning this test can be used) for the duration of the COVID-19 declaration under Section 56 4(b)(1) of the Act, 21 U.S.C. section 360bbb-3(b)(1), unless the authorization is terminated or revoked sooner. Performed at [facility], [place] [address] [place], [place] [address]          Radiology Studies: CT Angio Chest PE W and/or Wo Contrast  Result Date: 12/11/2019 CLINICAL DATA:  Shortness of breath, left leg and ankle swelling for several days with pitting edema, coughing clear mucus, preeclampsia and recent post partum EXAM: CT ANGIOGRAPHY CHEST WITH CONTRAST TECHNIQUE: Multidetector CT imaging of the chest was performed using the standard protocol during bolus administration of intravenous contrast. Multiplanar CT image reconstructions and MIPs were obtained to evaluate the vascular anatomy. CONTRAST:  79mL OMNIPAQUE IOHEXOL 350 MG/ML SOLN COMPARISON:  Chest radiograph 12/11/2018 FINDINGS: Cardiovascular: Satisfactory opacification the pulmonary arteries to the segmental level. No pulmonary artery filling defects are identified. Central pulmonary arteries are normal caliber. There is cardiomegaly with predominantly biatrial enlargement and notable reflux of the contrast bolus into the IVC and hepatic veins. Moderate pericardial effusion is present. The aorta is unopacified. Grossly normal aortic caliber. Mediastinum/Nodes:  Increased attenuation of the mediastinum may reflect some edematous changes and or fluid in the pericardial recess, difficult to assess given the timing of the contrast bolus. Lungs/Pleura: There is a moderate right pleural effusion. Redistribution of the pulmonary vascularity is noted with some fissural and septal thickening. Atelectatic changes are present as well predominantly in the right lung base with more patchy consolidative basilar opacities which could reflect a combination of alveolar opacity and atelectasis. No pneumothorax. No left effusion. Left basilar subsegmental atelectasis is noted. Upper Abdomen: Ascites present in the upper abdomen, low-attenuation (4 HU). Reflux of contrast into the hepatic veins, as above. No other acute abnormality in the upper abdomen. Musculoskeletal: No acute osseous abnormality or suspicious osseous lesion. No worrisome chest wall lesions. Mild circumferential body wall edema. Review of the MIP images confirms the above  findings. IMPRESSION: 1. No acute pulmonary artery filling defects to suggest pulmonary embolism. 2. Cardiomegaly and biatrial enlargement with refluxing contrast to the IVC and hepatic veins, suggesting elevated right heart pressures/right-sided dysfunction. Additional third-spacing of fluid including pleural and pericardial effusions and ascites as well as body wall edema. Given the clinical setting, such findings are worrisome for a postpartum cardiomyopathy or preeclampsia related cardiomyopathy. 3. Some more focal opacity in the right lung base favor a combination of atelectasis and alveolar edema though certainly infection could have a similar appearance. These results were called by telephone at the time of interpretation on 12/11/2019 at 10:46 pm to provider Geoffery LyonsUGLAS DELO , who verbally acknowledged these results. Electronically Signed   By: Kreg ShropshirePrice  DeHay M.D.   On: 12/11/2019 22:46   DG CHEST PORT 1 VIEW  Result Date: 12/13/2019 CLINICAL DATA:   Shortness of breath EXAM: PORTABLE CHEST 1 VIEW COMPARISON:  CT from 2 days ago FINDINGS: Cardiomegaly without significant pericardial effusion by CT. Trace right pleural effusion causing blunting of the lateral costophrenic sulcus. The left costophrenic sulcus is not covered. No edema or consolidation. No pneumothorax. IMPRESSION: Stable cardiomegaly and trace right pleural effusion. Electronically Signed   By: Marnee SpringJonathon  Watts M.D.   On: 12/13/2019 07:33   ECHOCARDIOGRAM COMPLETE  Result Date: 12/12/2019    ECHOCARDIOGRAM REPORT   Patient Name:   Roberta BellingLACIE Roberta Guzman Date of Exam: 12/12/2019 Medical Rec #:  [id number]   Height:       64.0 in Accession #:    [id number]  Weight:       165.0 lb Date of Birth:  02/16/1991    BSA:          1.803 m Patient Age:    28 years    BP:           132/102 mmHg Patient Gender: F           HR:           88 bpm. Exam Location:  Inpatient Procedure: 2D Echo Indications:    CHF-Acute Systolic 428.21 / I50.21  History:        Patient has no prior history of Echocardiogram examinations.                 Risk Factors:Hypertension, Diabetes and Current Smoker. Elevated                 troponin                 Edema.  Sonographer:    Leeroy Bockhelsea Turrentine Referring Phys: [id number] CHING T TU IMPRESSIONS  1. Left ventricular ejection fraction, by estimation, is 25 to 30%. The left ventricle has severely decreased function. The left ventricle demonstrates global hypokinesis. The left ventricular internal cavity size was mildly dilated. Left ventricular diastolic parameters are consistent with Grade III diastolic dysfunction (restrictive).  2. Right ventricular systolic function is moderately reduced. The right ventricular size is normal. There is moderately elevated pulmonary artery systolic pressure. The estimated right ventricular systolic pressure is 55.7 mmHg.  3. Left atrial size was severely dilated.  4. Right atrial size was severely dilated.  5. The mitral valve is normal in structure. Mild  mitral valve regurgitation.  6. Tricuspid valve regurgitation is moderate.  7. The aortic valve is normal in structure. Aortic valve regurgitation is not visualized.  8. The inferior vena cava is dilated in size with <50% respiratory variability, suggesting right atrial pressure of 15 mmHg. FINDINGS  Left  Ventricle: Left ventricular ejection fraction, by estimation, is 25 to 30%. The left ventricle has severely decreased function. The left ventricle demonstrates global hypokinesis. The left ventricular internal cavity size was mildly dilated. There is no left ventricular hypertrophy. Left ventricular diastolic parameters are consistent with Grade III diastolic dysfunction (restrictive). Normal left ventricular filling pressure. Right Ventricle: The right ventricular size is normal. No increase in right ventricular wall thickness. Right ventricular systolic function is moderately reduced. There is moderately elevated pulmonary artery systolic pressure. The tricuspid regurgitant velocity is 3.19 m/s, and with an assumed right atrial pressure of 15 mmHg, the estimated right ventricular systolic pressure is 55.7 mmHg. Left Atrium: Left atrial size was severely dilated. Right Atrium: Right atrial size was severely dilated. Prominent Eustachian valve. Pericardium: A small pericardial effusion is present. The pericardial effusion is circumferential. There is no evidence of cardiac tamponade. Mitral Valve: The mitral valve is normal in structure. Mild mitral valve regurgitation, with centrally-directed jet. Tricuspid Valve: The tricuspid valve is normal in structure. Tricuspid valve regurgitation is moderate. Aortic Valve: The aortic valve is normal in structure. Aortic valve regurgitation is not visualized. Pulmonic Valve: The pulmonic valve was normal in structure. Pulmonic valve regurgitation is trivial. Aorta: The aortic root and ascending aorta are structurally normal, with no evidence of dilitation. Venous: The  inferior vena cava is dilated in size with less than 50% respiratory variability, suggesting right atrial pressure of 15 mmHg. IAS/Shunts: No atrial level shunt detected by color flow Doppler.  LEFT VENTRICLE PLAX 2D LVIDd:         5.80 cm      Diastology LVIDs:         5.07 cm      LV e' lateral:   9.14 cm/s LV PW:         0.80 cm      LV E/e' lateral: 10.8 LV IVS:        0.80 cm LVOT diam:     1.90 cm LV SV:         39 LV SV Index:   22 LVOT Area:     2.84 cm  LV Volumes (MOD) LV vol d, MOD A4C: 121.4 ml LV vol s, MOD A4C: 94.5 ml LV SV MOD A4C:     121.4 ml RIGHT VENTRICLE RV S prime:     7.94 cm/s TAPSE (M-mode): 1.1 cm LEFT ATRIUM             Index       RIGHT ATRIUM           Index LA diam:        5.20 cm 2.88 cm/m  RA Area:     22.60 cm LA Vol (A2C):   62.7 ml 34.78 ml/m RA Volume:   64.30 ml  35.67 ml/m LA Vol (A4C):   83.2 ml 46.15 ml/m LA Biplane Vol: 76.4 ml 42.38 ml/m  AORTIC VALVE LVOT Vmax:   85.50 cm/s LVOT Vmean:  64.100 cm/s LVOT VTI:    0.138 m  AORTA Ao Root diam: 2.60 cm MITRAL VALVE               TRICUSPID VALVE MV Area (PHT): 5.97 cm    TR Peak grad:   40.7 mmHg MV Decel Time: 127 msec    TR Vmax:        319.00 cm/s MV E velocity: 98.50 cm/s MV A velocity: 40.30 cm/s  SHUNTS MV E/A ratio:  2.44  Systemic VTI:  0.14 m                            Systemic Diam: 1.90 cm Mihai Croitoru MD Electronically signed by Thurmon Fair MD Signature Date/Time: 12/12/2019/10:15:39 AM    Final         Scheduled Meds: . carvedilol  12.5 mg Oral BID WC  . enoxaparin (LOVENOX) injection  40 mg Subcutaneous Daily  . ferrous sulfate  325 mg Oral Q breakfast  . hydrocortisone   Rectal QID  . losartan  50 mg Oral Daily  . potassium chloride  40 mEq Oral BID  . spironolactone  12.5 mg Oral Daily   Continuous Infusions:   LOS: 1 day    Time spent: 40 minutes    Ramiro Harvest, MD [facility]   To contact the attending provider between 7A-7P or the covering provider  during after hours 7P-7A, please log into the web site [email or web address] and access using universal [place] password for that web site. If you do not have the password, please call the hospital operator.  12/13/2019, 9:42 PM

## 2019-12-13 NOTE — Progress Notes (Signed)
Progress Note  Patient Name: Roberta Guzman Date of Encounter: 12/13/2019  Primary Cardiologist: Reatha Harps, MD   Subjective   Last night blood pressure dropped to systolics in the 90s and patient felt very dizzy and short of breath. No chest pain. This morning patient is feeling better. Feels breathing has improved although still unable to lay flat. She put out 1.2L urine overnight.   Inpatient Medications    Scheduled Meds: . carvedilol  25 mg Oral BID WC  . enoxaparin (LOVENOX) injection  40 mg Subcutaneous Daily  . ferrous sulfate  325 mg Oral Q breakfast  . hydrocortisone   Rectal QID  . losartan  50 mg Oral Daily  . potassium chloride  40 mEq Oral BID  . spironolactone  12.5 mg Oral Daily   Continuous Infusions:  PRN Meds: acetaminophen   Vital Signs    Vitals:   12/12/19 2034 12/13/19 0149 12/13/19 0500 12/13/19 0512  BP: 97/76 91/69  107/76  Pulse: 94 91  92  Resp: 20     Temp: 97.7 F (36.5 C) 97.8 F (36.6 C)  98.2 F (36.8 C)  TempSrc: Oral Oral  Oral  SpO2: 100% 100%  100%  Weight:   72 kg   Height:        Intake/Output Summary (Last 24 hours) at 12/13/2019 0739 Last data filed at 12/12/2019 1856 Gross per 24 hour  Intake 480 ml  Output 1200 ml  Net -720 ml   Last 3 Weights 12/13/2019 12/11/2019 09/18/2019  Weight (lbs) 158 lb 11.7 oz 165 lb 180 lb  Weight (kg) 72 kg 74.844 kg 81.647 kg      Telemetry    NSR, HR 90s, 5bNSVT - Personally Reviewed  ECG    No new - Personally Reviewed  Physical Exam   GEN: No acute distress.   Neck: No JVD Cardiac: RRR, + murmur, no rubs, or gallops.  Respiratory: Clear to auscultation bilaterally. GI: Soft, nontender, non-distended  MS: Trace edema; No deformity. Neuro:  Nonfocal  Psych: Normal affect   Labs    High Sensitivity Troponin:   Recent Labs  Lab 12/11/19 1944 12/12/19 0350  TROPONINIHS 21* 23*      Chemistry Recent Labs  Lab 12/11/19 1944 12/12/19 0350 12/13/19 0426  NA  139 137 137  K 3.5 3.6 4.1  CL 108 109 107  CO2 23 18* 22  GLUCOSE 100* 82 114*  BUN 18 16 20   CREATININE 0.94 0.81 0.91  CALCIUM 8.3* 8.1* 8.0*  PROT 5.9*  --  5.5*  ALBUMIN 2.9*  --  2.7*  AST 27  --  21  ALT 24  --  19  ALKPHOS 47  --  40  BILITOT 1.8*  --  1.6*  GFRNONAA >60 >60 >60  GFRAA >60 >60 >60  ANIONGAP 8 10 8      Hematology Recent Labs  Lab 12/11/19 1944 12/12/19 0350 12/13/19 0426  WBC 7.5 7.9 6.8  RBC 3.36* 3.59* 3.26*  HGB 8.1* 8.6* 7.8*  HCT 28.0* 29.6* 26.6*  MCV 83.3 82.5 81.6  MCH 24.1* 24.0* 23.9*  MCHC 28.9* 29.1* 29.3*  RDW 18.6* 18.8* 18.6*  PLT 259 270 259    BNP Recent Labs  Lab 12/11/19 1944  BNP 1,377.9*     DDimer  Recent Labs  Lab 12/11/19 1944  DDIMER 4.67*     Radiology    DG Chest 2 View  Result Date: 12/11/2019 CLINICAL DATA:  Shortness of breath,  lower extremity edema, congestion, productive cough EXAM: CHEST - 2 VIEW COMPARISON:  None. FINDINGS: Frontal and lateral views of the chest demonstrate an enlarged cardiac silhouette. No airspace disease, effusion, or pneumothorax. No acute bony abnormalities. IMPRESSION: 1. Enlarged cardiac silhouette. If pericardial effusion is a concern, echocardiography may be useful. 2. No acute airspace disease. Electronically Signed   By: Sharlet SalinaMichael  Brown M.D.   On: 12/11/2019 19:34   CT Angio Chest PE W and/or Wo Contrast  Result Date: 12/11/2019 CLINICAL DATA:  Shortness of breath, left leg and ankle swelling for several days with pitting edema, coughing clear mucus, preeclampsia and recent post partum EXAM: CT ANGIOGRAPHY CHEST WITH CONTRAST TECHNIQUE: Multidetector CT imaging of the chest was performed using the standard protocol during bolus administration of intravenous contrast. Multiplanar CT image reconstructions and MIPs were obtained to evaluate the vascular anatomy. CONTRAST:  80mL OMNIPAQUE IOHEXOL 350 MG/ML SOLN COMPARISON:  Chest radiograph 12/11/2018 FINDINGS: Cardiovascular:  Satisfactory opacification the pulmonary arteries to the segmental level. No pulmonary artery filling defects are identified. Central pulmonary arteries are normal caliber. There is cardiomegaly with predominantly biatrial enlargement and notable reflux of the contrast bolus into the IVC and hepatic veins. Moderate pericardial effusion is present. The aorta is unopacified. Grossly normal aortic caliber. Mediastinum/Nodes: Increased attenuation of the mediastinum may reflect some edematous changes and or fluid in the pericardial recess, difficult to assess given the timing of the contrast bolus. Lungs/Pleura: There is a moderate right pleural effusion. Redistribution of the pulmonary vascularity is noted with some fissural and septal thickening. Atelectatic changes are present as well predominantly in the right lung base with more patchy consolidative basilar opacities which could reflect a combination of alveolar opacity and atelectasis. No pneumothorax. No left effusion. Left basilar subsegmental atelectasis is noted. Upper Abdomen: Ascites present in the upper abdomen, low-attenuation (4 HU). Reflux of contrast into the hepatic veins, as above. No other acute abnormality in the upper abdomen. Musculoskeletal: No acute osseous abnormality or suspicious osseous lesion. No worrisome chest wall lesions. Mild circumferential body wall edema. Review of the MIP images confirms the above findings. IMPRESSION: 1. No acute pulmonary artery filling defects to suggest pulmonary embolism. 2. Cardiomegaly and biatrial enlargement with refluxing contrast to the IVC and hepatic veins, suggesting elevated right heart pressures/right-sided dysfunction. Additional third-spacing of fluid including pleural and pericardial effusions and ascites as well as body wall edema. Given the clinical setting, such findings are worrisome for a postpartum cardiomyopathy or preeclampsia related cardiomyopathy. 3. Some more focal opacity in the  right lung base favor a combination of atelectasis and alveolar edema though certainly infection could have a similar appearance. These results were called by telephone at the time of interpretation on 12/11/2019 at 10:46 pm to provider Geoffery LyonsUGLAS DELO , who verbally acknowledged these results. Electronically Signed   By: Kreg ShropshirePrice  DeHay M.D.   On: 12/11/2019 22:46   DG CHEST PORT 1 VIEW  Result Date: 12/13/2019 CLINICAL DATA:  Shortness of breath EXAM: PORTABLE CHEST 1 VIEW COMPARISON:  CT from 2 days ago FINDINGS: Cardiomegaly without significant pericardial effusion by CT. Trace right pleural effusion causing blunting of the lateral costophrenic sulcus. The left costophrenic sulcus is not covered. No edema or consolidation. No pneumothorax. IMPRESSION: Stable cardiomegaly and trace right pleural effusion. Electronically Signed   By: Marnee SpringJonathon  Watts M.D.   On: 12/13/2019 07:33   ECHOCARDIOGRAM COMPLETE  Result Date: 12/12/2019    ECHOCARDIOGRAM REPORT   Patient Name:   Clayborn HeronLACIE Beste Date  of Exam: 12/12/2019 Medical Rec #:  [id number]   Height:       64.0 in Accession #:    [id number]  Weight:       165.0 lb Date of Birth:  Jun 15, 1991    BSA:          1.803 m Patient Age:    28 years    BP:           132/102 mmHg Patient Gender: F           HR:           88 bpm. Exam Location:  Inpatient Procedure: 2D Echo Indications:    CHF-Acute Systolic 428.21 / I50.21  History:        Patient has no prior history of Echocardiogram examinations.                 Risk Factors:Hypertension, Diabetes and Current Smoker. Elevated                 troponin                 Edema.  Sonographer:    Leeroy Bock Turrentine Referring Phys: [id number] CHING T TU IMPRESSIONS  1. Left ventricular ejection fraction, by estimation, is 25 to 30%. The left ventricle has severely decreased function. The left ventricle demonstrates global hypokinesis. The left ventricular internal cavity size was mildly dilated. Left ventricular diastolic parameters are  consistent with Grade III diastolic dysfunction (restrictive).  2. Right ventricular systolic function is moderately reduced. The right ventricular size is normal. There is moderately elevated pulmonary artery systolic pressure. The estimated right ventricular systolic pressure is 55.7 mmHg.  3. Left atrial size was severely dilated.  4. Right atrial size was severely dilated.  5. The mitral valve is normal in structure. Mild mitral valve regurgitation.  6. Tricuspid valve regurgitation is moderate.  7. The aortic valve is normal in structure. Aortic valve regurgitation is not visualized.  8. The inferior vena cava is dilated in size with <50% respiratory variability, suggesting right atrial pressure of 15 mmHg. FINDINGS  Left Ventricle: Left ventricular ejection fraction, by estimation, is 25 to 30%. The left ventricle has severely decreased function. The left ventricle demonstrates global hypokinesis. The left ventricular internal cavity size was mildly dilated. There is no left ventricular hypertrophy. Left ventricular diastolic parameters are consistent with Grade III diastolic dysfunction (restrictive). Normal left ventricular filling pressure. Right Ventricle: The right ventricular size is normal. No increase in right ventricular wall thickness. Right ventricular systolic function is moderately reduced. There is moderately elevated pulmonary artery systolic pressure. The tricuspid regurgitant velocity is 3.19 m/s, and with an assumed right atrial pressure of 15 mmHg, the estimated right ventricular systolic pressure is 55.7 mmHg. Left Atrium: Left atrial size was severely dilated. Right Atrium: Right atrial size was severely dilated. Prominent Eustachian valve. Pericardium: A small pericardial effusion is present. The pericardial effusion is circumferential. There is no evidence of cardiac tamponade. Mitral Valve: The mitral valve is normal in structure. Mild mitral valve regurgitation, with centrally-directed  jet. Tricuspid Valve: The tricuspid valve is normal in structure. Tricuspid valve regurgitation is moderate. Aortic Valve: The aortic valve is normal in structure. Aortic valve regurgitation is not visualized. Pulmonic Valve: The pulmonic valve was normal in structure. Pulmonic valve regurgitation is trivial. Aorta: The aortic root and ascending aorta are structurally normal, with no evidence of dilitation. Venous: The inferior vena cava is dilated in size with less than 50%  respiratory variability, suggesting right atrial pressure of 15 mmHg. IAS/Shunts: No atrial level shunt detected by color flow Doppler.  LEFT VENTRICLE PLAX 2D LVIDd:         5.80 cm      Diastology LVIDs:         5.07 cm      LV e' lateral:   9.14 cm/s LV PW:         0.80 cm      LV E/e' lateral: 10.8 LV IVS:        0.80 cm LVOT diam:     1.90 cm LV SV:         39 LV SV Index:   22 LVOT Area:     2.84 cm  LV Volumes (MOD) LV vol d, MOD A4C: 121.4 ml LV vol s, MOD A4C: 94.5 ml LV SV MOD A4C:     121.4 ml RIGHT VENTRICLE RV S prime:     7.94 cm/s TAPSE (M-mode): 1.1 cm LEFT ATRIUM             Index       RIGHT ATRIUM           Index LA diam:        5.20 cm 2.88 cm/m  RA Area:     22.60 cm LA Vol (A2C):   62.7 ml 34.78 ml/m RA Volume:   64.30 ml  35.67 ml/m LA Vol (A4C):   83.2 ml 46.15 ml/m LA Biplane Vol: 76.4 ml 42.38 ml/m  AORTIC VALVE LVOT Vmax:   85.50 cm/s LVOT Vmean:  64.100 cm/s LVOT VTI:    0.138 m  AORTA Ao Root diam: 2.60 cm MITRAL VALVE               TRICUSPID VALVE MV Area (PHT): 5.97 cm    TR Peak grad:   40.7 mmHg MV Decel Time: 127 msec    TR Vmax:        319.00 cm/s MV E velocity: 98.50 cm/s MV A velocity: 40.30 cm/s  SHUNTS MV E/A ratio:  2.44        Systemic VTI:  0.14 m                            Systemic Diam: 1.90 cm Dani Gobble Croitoru MD Electronically signed by Sanda Klein MD Signature Date/Time: 12/12/2019/10:15:39 AM    Final    VAS [place] LOWER EXTREMITY VENOUS (DVT) (MC and WL 7a-7p)  Result Date: 12/12/2019   Lower Venous DVTStudy Indications: Edema.  Comparison Study: no prior Performing Technologist: Abram Sander RVS  Examination Guidelines: A complete evaluation includes B-mode imaging, spectral Doppler, color Doppler, and power Doppler as needed of all accessible portions of each vessel. Bilateral testing is considered an integral part of a complete examination. Limited examinations for reoccurring indications may be performed as noted. The reflux portion of the exam is performed with the patient in reverse Trendelenburg.  +-----+---------------+---------+-----------+----------+--------------+ RIGHTCompressibilityPhasicitySpontaneityPropertiesThrombus Aging +-----+---------------+---------+-----------+----------+--------------+ CFV  Full           Yes      Yes                                 +-----+---------------+---------+-----------+----------+--------------+   +---------+---------------+---------+-----------+----------+--------------+ LEFT     CompressibilityPhasicitySpontaneityPropertiesThrombus Aging +---------+---------------+---------+-----------+----------+--------------+ CFV      Full           Yes  Yes                                 +---------+---------------+---------+-----------+----------+--------------+ SFJ      Full                                                        +---------+---------------+---------+-----------+----------+--------------+ FV Prox  Full                                                        +---------+---------------+---------+-----------+----------+--------------+ FV Mid   Full                                                        +---------+---------------+---------+-----------+----------+--------------+ FV DistalFull                                                        +---------+---------------+---------+-----------+----------+--------------+ PFV      Full                                                         +---------+---------------+---------+-----------+----------+--------------+ POP      Full           Yes      Yes                                 +---------+---------------+---------+-----------+----------+--------------+ PTV      Full                                                        +---------+---------------+---------+-----------+----------+--------------+ PERO     Full                                                        +---------+---------------+---------+-----------+----------+--------------+     Summary: RIGHT: - No evidence of common femoral vein obstruction.  LEFT: - There is no evidence of deep vein thrombosis in the lower extremity.  - No cystic structure found in the popliteal fossa.  *See table(s) above for measurements and observations. Electronically signed by Waverly Ferrari MD on 12/12/2019 at 3:21:15 PM.    Final     Cardiac Studies   Echo 12/13/19 1. Left ventricular ejection fraction, by  estimation, is 25 to 30%. The  left ventricle has severely decreased function. The left ventricle  demonstrates global hypokinesis. The left ventricular internal cavity size  was mildly dilated. Left ventricular  diastolic parameters are consistent with Grade III diastolic dysfunction  (restrictive).  2. Right ventricular systolic function is moderately reduced. The right  ventricular size is normal. There is moderately elevated pulmonary artery  systolic pressure. The estimated right ventricular systolic pressure is  55.7 mmHg.  3. Left atrial size was severely dilated.  4. Right atrial size was severely dilated.  5. The mitral valve is normal in structure. Mild mitral valve  regurgitation.  6. Tricuspid valve regurgitation is moderate.  7. The aortic valve is normal in structure. Aortic valve regurgitation is  not visualized.  8. The inferior vena cava is dilated in size with <50% respiratory  variability, suggesting right atrial pressure of 15  mmHg.  Patient Profile     29 y.o. 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 Suspect this is not complete given significant drop in weight - weight down from yesterday 7lbs - creatinine stable at 0.91 - started on Coreg 25mg  BID and Losartan 50 mg QD. Spironolactone 12.5mg  to start this AM. Pressures soft overnight. Will decrease Coreg. Continue to monitor - LLE significantly better although patient is still having trouble breathing while laying flat. Will give another dose of Lasix.   Elevated troponin - very minimally elevated to 21>23 - likely demand ischemia  Hypertension - Coreg, Losartan and spiro as above    For questions or updates, please contact [facility] Please consult [email or web address] for contact info under        Signed, Viki Carrera , PA-C  12/13/2019, 7:39 AM

## 2019-12-14 DIAGNOSIS — R778 Other specified abnormalities of plasma proteins: Secondary | ICD-10-CM | POA: Diagnosis not present

## 2019-12-14 DIAGNOSIS — F17200 Nicotine dependence, unspecified, uncomplicated: Secondary | ICD-10-CM | POA: Diagnosis not present

## 2019-12-14 DIAGNOSIS — O903 Peripartum cardiomyopathy: Secondary | ICD-10-CM | POA: Diagnosis not present

## 2019-12-14 DIAGNOSIS — I5041 Acute combined systolic (congestive) and diastolic (congestive) heart failure: Secondary | ICD-10-CM

## 2019-12-14 LAB — BASIC METABOLIC PANEL
Anion gap: 9 (ref 5–15)
BUN: 22 mg/dL — ABNORMAL HIGH (ref 6–20)
CO2: 19 mmol/L — ABNORMAL LOW (ref 22–32)
Calcium: 8.1 mg/dL — ABNORMAL LOW (ref 8.9–10.3)
Chloride: 107 mmol/L (ref 98–111)
Creatinine, Ser: 1.05 mg/dL — ABNORMAL HIGH (ref 0.44–1.00)
GFR calc Af Amer: >60 mL/min (ref >60)
GFR calc non Af Amer: >60 mL/min (ref >60)
Glucose, Bld: 104 mg/dL — ABNORMAL HIGH (ref 70–99)
Potassium: 4.8 mmol/L (ref 3.5–5.1)
Sodium: 135 mmol/L (ref 135–145)

## 2019-12-14 LAB — CBC
HCT: 29.2 % — ABNORMAL LOW (ref 36.0–46.0)
Hemoglobin: 8.5 g/dL — ABNORMAL LOW (ref 12.0–15.0)
MCH: 23.7 pg — ABNORMAL LOW (ref 26.0–34.0)
MCHC: 29.1 g/dL — ABNORMAL LOW (ref 30.0–36.0)
MCV: 81.3 fL (ref 80.0–100.0)
Platelets: 277 10*3/uL (ref 150–400)
RBC: 3.59 MIL/uL — ABNORMAL LOW (ref 3.87–5.11)
RDW: 18.9 % — ABNORMAL HIGH (ref 11.5–15.5)
WBC: 8.2 10*3/uL (ref 4.0–10.5)
nRBC: 1.2 % — ABNORMAL HIGH (ref 0.0–0.2)

## 2019-12-14 LAB — MAGNESIUM: Magnesium: 2 mg/dL (ref 1.7–2.4)

## 2019-12-14 LAB — HAPTOGLOBIN: Haptoglobin: 163 mg/dL (ref 33–278)

## 2019-12-14 LAB — GLUCOSE, CAPILLARY
Glucose-Capillary: 111 mg/dL — ABNORMAL HIGH (ref 70–99)
Glucose-Capillary: 101 mg/dL — ABNORMAL HIGH (ref 70–99)

## 2019-12-14 MED ORDER — CARVEDILOL 12.5 MG PO TABS: 12.5000 mg | ORAL_TABLET | Freq: Two times a day (BID) | ORAL | 1 refills | Status: DC

## 2019-12-14 MED ORDER — SENNOSIDES-DOCUSATE SODIUM 8.6-50 MG PO TABS: 1.0000 | ORAL_TABLET | Freq: Two times a day (BID) | ORAL | Status: AC

## 2019-12-14 MED ORDER — BENZONATATE 200 MG PO CAPS: 200.0000 mg | ORAL_CAPSULE | Freq: Three times a day (TID) | ORAL | 0 refills | Status: AC | PRN

## 2019-12-14 MED ORDER — HYDROCORTISONE (PERIANAL) 2.5 % EX CREA: TOPICAL_CREAM | Freq: Four times a day (QID) | CUTANEOUS | 0 refills | Status: AC

## 2019-12-14 MED ORDER — SPIRONOLACTONE 25 MG PO TABS: 12.5000 mg | ORAL_TABLET | Freq: Every day | ORAL | 0 refills | Status: DC

## 2019-12-14 MED ORDER — SODIUM BICARBONATE 650 MG PO TABS
650.0000 mg | ORAL_TABLET | Freq: Two times a day (BID) | ORAL | Status: DC
  Administered 2019-12-14: 650 mg via ORAL
  Filled 2019-12-14: qty 1

## 2019-12-14 MED ORDER — LOSARTAN POTASSIUM 50 MG PO TABS: 50.0000 mg | ORAL_TABLET | Freq: Every day | ORAL | 1 refills | Status: DC

## 2019-12-14 MED ORDER — SENNOSIDES-DOCUSATE SODIUM 8.6-50 MG PO TABS
1.0000 | ORAL_TABLET | Freq: Two times a day (BID) | ORAL | Status: DC
  Administered 2019-12-14: 1 via ORAL
  Filled 2019-12-14: qty 1

## 2019-12-14 MED ORDER — GUAIFENESIN 100 MG/5ML PO SOLN
15.0000 mL | ORAL | Status: DC | PRN
  Administered 2019-12-14: 300 mg via ORAL
  Filled 2019-12-14: qty 20

## 2019-12-14 MED ORDER — BENZONATATE 100 MG PO CAPS: 200.0000 mg | ORAL_CAPSULE | Freq: Three times a day (TID) | ORAL | Status: DC | PRN

## 2019-12-14 MED ORDER — TRAMADOL HCL 50 MG PO TABS
50.0000 mg | ORAL_TABLET | Freq: Two times a day (BID) | ORAL | Status: AC | PRN
  Administered 2019-12-14: 50 mg via ORAL
  Filled 2019-12-14: qty 1

## 2019-12-14 MED ORDER — FUROSEMIDE 40 MG PO TABS: 40.0000 mg | ORAL_TABLET | Freq: Every day | ORAL | 1 refills | Status: DC | PRN

## 2019-12-14 NOTE — Progress Notes (Signed)
Cardiology Progress Note  Patient ID: Roberta Guzman MRN: [id number] DOB: 1991/03/10 Date of Encounter: 12/14/2019  Primary Cardiologist: Reatha Harps, MD  Subjective  Feeling well today. No need for further diuresis.   ROS:  All other ROS reviewed and negative. Pertinent positives noted in the HPI.     Inpatient Medications  Scheduled Meds: . carvedilol  12.5 mg Oral BID WC  . enoxaparin (LOVENOX) injection  40 mg Subcutaneous Daily  . ferrous sulfate  325 mg Oral Q breakfast  . hydrocortisone   Rectal QID  . losartan  50 mg Oral Daily  . senna-docusate  1 tablet Oral BID  . sodium bicarbonate  650 mg Oral BID  . spironolactone  12.5 mg Oral Daily   Continuous Infusions:  PRN Meds: acetaminophen, guaiFENesin   Vital Signs   Vitals:   12/13/19 2307 12/14/19 0013 12/14/19 0639 12/14/19 0840  BP: 101/72 99/76 102/75 105/74  Pulse: 96 96 95 94  Resp: 19  18 16   Temp: 97.7 F (36.5 C) 97.8 F (36.6 C) 98.5 F (36.9 C) 98.2 F (36.8 C)  TempSrc: Oral Oral Oral Oral  SpO2: 100% 98% 100% 100%  Weight:   68.5 kg   Height:        Intake/Output Summary (Last 24 hours) at 12/14/2019 1212 Last data filed at 12/14/2019 0945 Gross per 24 hour  Intake 360 ml  Output --  Net 360 ml   Last 3 Weights 12/14/2019 12/13/2019 12/11/2019  Weight (lbs) 151 lb 158 lb 11.7 oz 165 lb  Weight (kg) 68.493 kg 72 kg 74.844 kg      Telemetry  Overnight telemetry shows SR 80s, which I personally reviewed.   ECG  The most recent ECG shows ST 108, bi atrial enlargement, which I personally reviewed.   Physical Exam   Vitals:   12/13/19 2307 12/14/19 0013 12/14/19 0639 12/14/19 0840  BP: 101/72 99/76 102/75 105/74  Pulse: 96 96 95 94  Resp: 19  18 16   Temp: 97.7 F (36.5 C) 97.8 F (36.6 C) 98.5 F (36.9 C) 98.2 F (36.8 C)  TempSrc: Oral Oral Oral Oral  SpO2: 100% 98% 100% 100%  Weight:   68.5 kg   Height:         Intake/Output Summary (Last 24 hours) at 12/14/2019 1212 Last  data filed at 12/14/2019 0945 Gross per 24 hour  Intake 360 ml  Output --  Net 360 ml    Last 3 Weights 12/14/2019 12/13/2019 12/11/2019  Weight (lbs) 151 lb 158 lb 11.7 oz 165 lb  Weight (kg) 68.493 kg 72 kg 74.844 kg    Body mass index is 25.92 kg/m.   General: Well nourished, well developed, in no acute distress Head: Atraumatic, normal size  Eyes: PEERLA, EOMI  Neck: Supple, no JVD Endocrine: No thryomegaly Cardiac: Normal S1, S2; RRR; no murmurs, rubs, or gallops Lungs: Clear to auscultation bilaterally, no wheezing, rhonchi or rales  Abd: Soft, nontender, no hepatomegaly  Ext: No edema, pulses 2+ Musculoskeletal: No deformities, BUE and BLE strength normal and equal Skin: Warm and dry, no rashes   Neuro: Alert and oriented to person, place, time, and situation, CNII-XII grossly intact, no focal deficits  Psych: Normal mood and affect   Labs  High Sensitivity Troponin:   Recent Labs  Lab 12/11/19 1944 12/12/19 0350  TROPONINIHS 21* 23*     Cardiac EnzymesNo results for input(s): TROPONINI in the last 168 hours. No results for input(s): TROPIPOC in the  last 168 hours.  Chemistry Recent Labs  Lab 12/11/19 1944 12/11/19 1944 12/12/19 0350 12/13/19 0426 12/14/19 0431  NA 139   < > 137 137 135  K 3.5   < > 3.6 4.1 4.8  CL 108   < > 109 107 107  CO2 23   < > 18* 22 19*  GLUCOSE 100*   < > 82 114* 104*  BUN 18   < > 16 20 22*  CREATININE 0.94   < > 0.81 0.91 1.05*  CALCIUM 8.3*   < > 8.1* 8.0* 8.1*  PROT 5.9*  --   --  5.5*  --   ALBUMIN 2.9*  --   --  2.7*  --   AST 27  --   --  21  --   ALT 24  --   --  19  --   ALKPHOS 47  --   --  40  --   BILITOT 1.8*  --   --  1.6*  --   GFRNONAA >60   < > >60 >60 >60  GFRAA >60   < > >60 >60 >60  ANIONGAP 8   < > 10 8 9    < > = values in this interval not displayed.    Hematology Recent Labs  Lab 12/12/19 0350 12/13/19 0426 12/14/19 0431  WBC 7.9 6.8 8.2  RBC 3.59* 3.26* 3.59*  HGB 8.6* 7.8* 8.5*  HCT 29.6*  26.6* 29.2*  MCV 82.5 81.6 81.3  MCH 24.0* 23.9* 23.7*  MCHC 29.1* 29.3* 29.1*  RDW 18.8* 18.6* 18.9*  PLT 270 259 277   BNP Recent Labs  Lab 12/11/19 1944  BNP 1,377.9*    DDimer  Recent Labs  Lab 12/11/19 1944  DDIMER 4.67*     Radiology  DG CHEST PORT 1 VIEW  Result Date: 12/13/2019 CLINICAL DATA:  Shortness of breath EXAM: PORTABLE CHEST 1 VIEW COMPARISON:  CT from 2 days ago FINDINGS: Cardiomegaly without significant pericardial effusion by CT. Trace right pleural effusion causing blunting of the lateral costophrenic sulcus. The left costophrenic sulcus is not covered. No edema or consolidation. No pneumothorax. IMPRESSION: Stable cardiomegaly and trace right pleural effusion. Electronically Signed   By: 12/15/2019 M.D.   On: 12/13/2019 07:33    Cardiac Studies  TTE 12/12/2019 1. Left ventricular ejection fraction, by estimation, is 25 to 30%. The  left ventricle has severely decreased function. The left ventricle  demonstrates global hypokinesis. The left ventricular internal cavity size  was mildly dilated. Left ventricular  diastolic parameters are consistent with Grade III diastolic dysfunction  (restrictive).  2. Right ventricular systolic function is moderately reduced. The right  ventricular size is normal. There is moderately elevated pulmonary artery  systolic pressure. The estimated right ventricular systolic pressure is  55.7 mmHg.  3. Left atrial size was severely dilated.  4. Right atrial size was severely dilated.  5. The mitral valve is normal in structure. Mild mitral valve  regurgitation.  6. Tricuspid valve regurgitation is moderate.  7. The aortic valve is normal in structure. Aortic valve regurgitation is  not visualized.  8. The inferior vena cava is dilated in size with <50% respiratory  variability, suggesting right atrial pressure of 15 mmHg.   Patient Profile  Roberta Guzman is a 29 y.o. female with history of HTN who was  admitted with new onset systolic HF after recent delivery.   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   Medication Recommendations:  As above Other recommendations (labs, testing, etc):  none Follow up as an outpatient:  1 week APP appointment in our office, 4-6 weeks to see me   For questions or updates, please contact [facility] Please consult [email or web address] for contact info under   Time Spent with Patient: I have spent a total of 25 minutes with patient reviewing hospital notes, telemetry, EKGs, labs and examining the patient as well as establishing an assessment and plan that was discussed with the patient.  > 50% of time was spent in direct patient care.    Signed, Addison Naegeli. Audie Box, [place]  12/14/2019 12:12 PM

## 2019-12-14 NOTE — Discharge Summary (Signed)
Physician Discharge Summary  Davonna BellingLacie Brazell WUJ:[id number] DOB: 03/01/1991 DOA: 12/11/2019  PCP: Patient, No Pcp Per  Admit date: 12/11/2019 Discharge date: 12/14/2019  Time spent: 60 minutes  Recommendations for Outpatient Follow-up:  1. Follow-up with Edd FabianJesse Cleaver, NP cardiology on 12/26/2019 at 8:45 AM.  On follow-up patient's acute CHF and peripartum cardiomyopathy will need to be reassessed.  Patient will need a basic metabolic profile done to follow-up on electrolytes and renal function. 2. Follow-up with PCP in 2 weeks.  On follow-up patient needs a CBC done to follow-up on H&H as well as a basic metabolic profile done to follow-up on patient's electrolytes and renal function.   Discharge Diagnoses:  Principal Problem:   Acute CHF (congestive heart failure) (HCC) Active Problems:   Postpartum cardiomyopathy   Current smoker   Iron deficiency anemia   Essential hypertension   Elevated troponin   Discharge Condition: Stable and improved  Diet recommendation: Heart healthy  Filed Weights   12/11/19 1825 12/13/19 0500 12/14/19 0639  Weight: 74.8 kg 72 kg 68.5 kg    History of present illness:  HPI per Dr. Wilhemena Durieu  Joory Effertz is a 29 y.o. female with medical history significant for Diet-controlled gestational diabetes, hypertension, iron deficiency anemia, hx of nephrolithiasis who presented with concerns of increase lower extremity edema.   Patient recently delivered her first child on 2/28.  Pregnancy was complicated by gestational diabetes that was diet-controlled.  She reported that later on she began to have issues with her blood pressure but was never formally diagnosed with preeclampsia.  She had since been placed on labetalol.  She also noted that she had some heavy bleeding during her vaginal delivery and had been placed on iron. Patient noted recently she had noticed a mild cough and yesterday she noticed significant bilateral lower extremity edema.  She also noted shortness  of breath when walking long distances.  She denied any chest pain.  Patient has smoked for about 10 years and has cut down to 2 cigarettes/day.  Has occasional alcohol use but no illicit drug use.  Has significant history of congestive heart failure in mother and father who were diagnosed in their 5850s.  Pt was not breast-feeding.   ED Course: She was afebrile, mildly tachycardic and normotensive on room air.  Lab work showed no leukocytosis and had hemoglobin of 8.1 which is down from her prior baseline of around 9.  Glucose of 100.  Mildly elevated total bilirubin of 1.8.  BNP significantly elevated at 1277.  Troponin of 21.  CTA chest showed no PE but had cardiomegaly with biatrial enlargement with reflux of contrast suggesting a right heart dysfunction.  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  Patient with first child October 29, 2019 which was complicated by gestational diabetes which was diet-controlled.  Patient also noted to have issues with blood pressure.  On admission BNP significantly elevated at 1277.  Troponins minimally elevated and flattened.  CT chest negative for PE but cardiomegaly and biatrial enlargement noted with reflux of contrast to the IVC and hepatic veins suggesting elevated right heart pressures/right-sided dysfunction.  2D echo done with a EF of 25 to 30% with left ventricular global hypokinesis, grade 3 diastolic dysfunction, right ventricular systolic function moderately reduced, moderately elevated pulmonary artery systolic pressures, left atrial size severely dilated, right atrial size severely dilated, moderate tricuspid valvular regurgitation,  mild mitral valve regurgitation.  Patient seen by cardiology placed on IV Lasix with output.  Patient was -2.860 L during this hospitalization.   Patient improved clinically.  IV Lasix was subsequently discontinued per cardiology.  Patient maintained on Coreg, losartan, Aldactone.  Scheduled potassium supplementation was discontinued.  It was recommended per cardiology the patient may be discharged home on Lasix 40 mg daily as needed. Cardiology also advised patient against further pregnancy in the unforeseeable future.  Patient not breast-feeding.  Outpatient follow-up with cardiology 1 week post discharge.   2.  Iron deficiency anemia Patient denied any overt bleeding.  Hemoglobin stabilized at 8.5 by day of discharge.  Patient maintained on oral iron supplementation during the hospitalization.  Outpatient follow-up.  3.  Hypertension Blood pressure remains controlled on Coreg, Cozaar and Aldactone.  Outpatient follow-up with PCP and cardiology.   4.  Elevated troponin Likely demand ischemia secondary to problem #1. Patient was followed by cardiology during the hospitalization.  Outpatient follow-up with cardiology.  5.  Tobacco abuse Tobacco cessation stressed to patient.  Outpatient follow-up..  Procedures:  2D echo 12/12/2019  Lower extremity Dopplers 12/11/2019  Chest x-ray 12/11/2019, 12/13/2019  CT angiogram chest 12/11/2019    Consultations:  Cardiology: Dr. Flora Lipps 12/12/2019  Discharge Exam: Vitals:   12/14/19 0840 12/14/19 1336  BP: 105/74 107/67  Pulse: 94 88  Resp: 16 14  Temp: 98.2 F (36.8 C) 97.7 F (36.5 C)  SpO2: 100% 100%    General: NAD Cardiovascular: RRR Respiratory: CTAB  Discharge Instructions   Discharge Instructions    Diet - low sodium heart healthy   Complete by: As directed    Increase activity slowly   Complete by: As directed    Increase activity slowly   Complete by: As directed      Allergies as of 12/14/2019   No Known Allergies     Medication List    STOP taking these medications   docusate calcium 240 MG capsule Commonly known as: SURFAK   Prenate Mini  29-0.6-0.4-350 MG Caps     TAKE these medications   acetaminophen 325 MG tablet Commonly known as: TYLENOL Take 650 mg by mouth every 6 (six) hours as needed (pain.).   aspirin EC 81 MG tablet Take 1 tablet (81 mg total) by mouth daily. Take daily for preeclampsia prevention.   benzonatate 200 MG capsule Commonly known as: TESSALON Take 1 capsule (200 mg total) by mouth 3 (three) times daily as needed for cough.   carvedilol 12.5 MG tablet Commonly known as: COREG Take 1 tablet (12.5 mg total) by mouth 2 (two) times daily with a meal.   furosemide 40 MG tablet Commonly known as: Lasix Take 1 tablet (40 mg total) by mouth daily as needed for fluid or edema.   hydrocortisone 2.5 % rectal cream Commonly known as: ANUSOL-HC Place rectally 4 (four) times daily.   iron polysaccharides 150 MG capsule Commonly known as: NIFEREX Take 1 capsule (150 mg total) by mouth daily.   losartan 50 MG tablet Commonly known as: COZAAR Take 1 tablet (50 mg total) by mouth daily. Start taking on: December 15, 2019   senna-docusate 8.6-50 MG tablet Commonly known as: Senokot-S Take 1 tablet by mouth 2 (two) times daily.   spironolactone 25 MG tablet Commonly known as: ALDACTONE Take 0.5 tablets (12.5 mg total) by mouth daily. Start taking on: December 15, 2019      No Known Allergies Follow-up Information    Ronney Asters,  NP Follow up on 12/26/2019.   Specialty: Cardiology Why: Hospital follow up 4/27 at 8:45 AM Contact information: [address] [place] [address] [phone]        O'Neal, Ronnald Ramp, MD .   Specialties: Internal Medicine, Cardiology, Radiology Contact information: [address] [place] [address] [phone]        PCP. Schedule an appointment as soon as possible for a visit in 2 week(s).            The results of significant diagnostics from this hospitalization (including imaging, microbiology, ancillary and laboratory)  are listed below for reference.    Significant Diagnostic Studies: DG Chest 2 View  Result Date: 12/11/2019 CLINICAL DATA:  Shortness of breath, lower extremity edema, congestion, productive cough EXAM: CHEST - 2 VIEW COMPARISON:  None. FINDINGS: Frontal and lateral views of the chest demonstrate an enlarged cardiac silhouette. No airspace disease, effusion, or pneumothorax. No acute bony abnormalities. IMPRESSION: 1. Enlarged cardiac silhouette. If pericardial effusion is a concern, echocardiography may be useful. 2. No acute airspace disease. Electronically Signed   By: Sharlet Salina M.D.   On: 12/11/2019 19:34   CT Angio Chest PE W and/or Wo Contrast  Result Date: 12/11/2019 CLINICAL DATA:  Shortness of breath, left leg and ankle swelling for several days with pitting edema, coughing clear mucus, preeclampsia and recent post partum EXAM: CT ANGIOGRAPHY CHEST WITH CONTRAST TECHNIQUE: Multidetector CT imaging of the chest was performed using the standard protocol during bolus administration of intravenous contrast. Multiplanar CT image reconstructions and MIPs were obtained to evaluate the vascular anatomy. CONTRAST:  46mL OMNIPAQUE IOHEXOL 350 MG/ML SOLN COMPARISON:  Chest radiograph 12/11/2018 FINDINGS: Cardiovascular: Satisfactory opacification the pulmonary arteries to the segmental level. No pulmonary artery filling defects are identified. Central pulmonary arteries are normal caliber. There is cardiomegaly with predominantly biatrial enlargement and notable reflux of the contrast bolus into the IVC and hepatic veins. Moderate pericardial effusion is present. The aorta is unopacified. Grossly normal aortic caliber. Mediastinum/Nodes: Increased attenuation of the mediastinum may reflect some edematous changes and or fluid in the pericardial recess, difficult to assess given the timing of the contrast bolus. Lungs/Pleura: There is a moderate right pleural effusion. Redistribution of the pulmonary  vascularity is noted with some fissural and septal thickening. Atelectatic changes are present as well predominantly in the right lung base with more patchy consolidative basilar opacities which could reflect a combination of alveolar opacity and atelectasis. No pneumothorax. No left effusion. Left basilar subsegmental atelectasis is noted. Upper Abdomen: Ascites present in the upper abdomen, low-attenuation (4 HU). Reflux of contrast into the hepatic veins, as above. No other acute abnormality in the upper abdomen. Musculoskeletal: No acute osseous abnormality or suspicious osseous lesion. No worrisome chest wall lesions. Mild circumferential body wall edema. Review of the MIP images confirms the above findings. IMPRESSION: 1. No acute pulmonary artery filling defects to suggest pulmonary embolism. 2. Cardiomegaly and biatrial enlargement with refluxing contrast to the IVC and hepatic veins, suggesting elevated right heart pressures/right-sided dysfunction. Additional third-spacing of fluid including pleural and pericardial effusions and ascites as well as body wall edema. Given the clinical setting, such findings are worrisome for a postpartum cardiomyopathy or preeclampsia related cardiomyopathy. 3. Some more focal opacity in the right lung base favor a combination of atelectasis and alveolar edema though certainly infection could have a similar appearance. These results were called by telephone at the time of interpretation on 12/11/2019 at 10:46 pm to provider  Geoffery Lyons , who verbally acknowledged these results. Electronically Signed   By: Kreg Shropshire M.D.   On: 12/11/2019 22:46   DG CHEST PORT 1 VIEW  Result Date: 12/13/2019 CLINICAL DATA:  Shortness of breath EXAM: PORTABLE CHEST 1 VIEW COMPARISON:  CT from 2 days ago FINDINGS: Cardiomegaly without significant pericardial effusion by CT. Trace right pleural effusion causing blunting of the lateral costophrenic sulcus. The left costophrenic sulcus is  not covered. No edema or consolidation. No pneumothorax. IMPRESSION: Stable cardiomegaly and trace right pleural effusion. Electronically Signed   By: Marnee Spring M.D.   On: 12/13/2019 07:33   ECHOCARDIOGRAM COMPLETE  Result Date: 12/12/2019    ECHOCARDIOGRAM REPORT   Patient Name:   Roberta Guzman Date of Exam: 12/12/2019 Medical Rec #:  [id number]   Height:       64.0 in Accession #:    [id number]  Weight:       165.0 lb Date of Birth:  [birth year]    BSA:          1.803 m Patient Age:    28 years    BP:           132/102 mmHg Patient Gender: F           HR:           88 bpm. Exam Location:  Inpatient Procedure: 2D Echo Indications:    CHF-Acute Systolic 428.21 / I50.21  History:        Patient has no prior history of Echocardiogram examinations.                 Risk Factors:Hypertension, Diabetes and Current Smoker. Elevated                 troponin                 Edema.  Sonographer:    Leeroy Bock Turrentine Referring Phys: [id number] CHING T TU IMPRESSIONS  1. Left ventricular ejection fraction, by estimation, is 25 to 30%. The left ventricle has severely decreased function. The left ventricle demonstrates global hypokinesis. The left ventricular internal cavity size was mildly dilated. Left ventricular diastolic parameters are consistent with Grade III diastolic dysfunction (restrictive).  2. Right ventricular systolic function is moderately reduced. The right ventricular size is normal. There is moderately elevated pulmonary artery systolic pressure. The estimated right ventricular systolic pressure is 55.7 mmHg.  3. Left atrial size was severely dilated.  4. Right atrial size was severely dilated.  5. The mitral valve is normal in structure. Mild mitral valve regurgitation.  6. Tricuspid valve regurgitation is moderate.  7. The aortic valve is normal in structure. Aortic valve regurgitation is not visualized.  8. The inferior vena cava is dilated in size with <50% respiratory variability, suggesting right atrial  pressure of 15 mmHg. FINDINGS  Left Ventricle: Left ventricular ejection fraction, by estimation, is 25 to 30%. The left ventricle has severely decreased function. The left ventricle demonstrates global hypokinesis. The left ventricular internal cavity size was mildly dilated. There is no left ventricular hypertrophy. Left ventricular diastolic parameters are consistent with Grade III diastolic dysfunction (restrictive). Normal left ventricular filling pressure. Right Ventricle: The right ventricular size is normal. No increase in right ventricular wall thickness. Right ventricular systolic function is moderately reduced. There is moderately elevated pulmonary artery systolic pressure. The tricuspid regurgitant velocity is 3.19 m/s, and with an assumed right atrial pressure of 15 mmHg, the estimated right ventricular systolic pressure is  55.7 mmHg. Left Atrium: Left atrial size was severely dilated. Right Atrium: Right atrial size was severely dilated. Prominent Eustachian valve. Pericardium: A small pericardial effusion is present. The pericardial effusion is circumferential. There is no evidence of cardiac tamponade. Mitral Valve: The mitral valve is normal in structure. Mild mitral valve regurgitation, with centrally-directed jet. Tricuspid Valve: The tricuspid valve is normal in structure. Tricuspid valve regurgitation is moderate. Aortic Valve: The aortic valve is normal in structure. Aortic valve regurgitation is not visualized. Pulmonic Valve: The pulmonic valve was normal in structure. Pulmonic valve regurgitation is trivial. Aorta: The aortic root and ascending aorta are structurally normal, with no evidence of dilitation. Venous: The inferior vena cava is dilated in size with less than 50% respiratory variability, suggesting right atrial pressure of 15 mmHg. IAS/Shunts: No atrial level shunt detected by color flow Doppler.  LEFT VENTRICLE PLAX 2D LVIDd:         5.80 cm      Diastology LVIDs:         5.07  cm      LV e' lateral:   9.14 cm/s LV PW:         0.80 cm      LV E/e' lateral: 10.8 LV IVS:        0.80 cm LVOT diam:     1.90 cm LV SV:         39 LV SV Index:   22 LVOT Area:     2.84 cm  LV Volumes (MOD) LV vol d, MOD A4C: 121.4 ml LV vol s, MOD A4C: 94.5 ml LV SV MOD A4C:     121.4 ml RIGHT VENTRICLE RV S prime:     7.94 cm/s TAPSE (M-mode): 1.1 cm LEFT ATRIUM             Index       RIGHT ATRIUM           Index LA diam:        5.20 cm 2.88 cm/m  RA Area:     22.60 cm LA Vol (A2C):   62.7 ml 34.78 ml/m RA Volume:   64.30 ml  35.67 ml/m LA Vol (A4C):   83.2 ml 46.15 ml/m LA Biplane Vol: 76.4 ml 42.38 ml/m  AORTIC VALVE LVOT Vmax:   85.50 cm/s LVOT Vmean:  64.100 cm/s LVOT VTI:    0.138 m  AORTA Ao Root diam: 2.60 cm MITRAL VALVE               TRICUSPID VALVE MV Area (PHT): 5.97 cm    TR Peak grad:   40.7 mmHg MV Decel Time: 127 msec    TR Vmax:        319.00 cm/s MV E velocity: 98.50 cm/s MV A velocity: 40.30 cm/s  SHUNTS MV E/A ratio:  2.44        Systemic VTI:  0.14 m                            Systemic Diam: 1.90 cm Dani Gobble Croitoru MD Electronically signed by Sanda Klein MD Signature Date/Time: 12/12/2019/10:15:39 AM    Final    VAS [place] LOWER EXTREMITY VENOUS (DVT) (MC and WL 7a-7p)  Result Date: 12/12/2019  Lower Venous DVTStudy Indications: Edema.  Comparison Study: no prior Performing Technologist: Abram Sander RVS  Examination Guidelines: A complete evaluation includes B-mode imaging, spectral Doppler, color Doppler, and power Doppler as needed of all  accessible portions of each vessel. Bilateral testing is considered an integral part of a complete examination. Limited examinations for reoccurring indications may be performed as noted. The reflux portion of the exam is performed with the patient in reverse Trendelenburg.  +-----+---------------+---------+-----------+----------+--------------+ RIGHTCompressibilityPhasicitySpontaneityPropertiesThrombus Aging  +-----+---------------+---------+-----------+----------+--------------+ CFV  Full           Yes      Yes                                 +-----+---------------+---------+-----------+----------+--------------+   +---------+---------------+---------+-----------+----------+--------------+ LEFT     CompressibilityPhasicitySpontaneityPropertiesThrombus Aging +---------+---------------+---------+-----------+----------+--------------+ CFV      Full           Yes      Yes                                 +---------+---------------+---------+-----------+----------+--------------+ SFJ      Full                                                        +---------+---------------+---------+-----------+----------+--------------+ FV Prox  Full                                                        +---------+---------------+---------+-----------+----------+--------------+ FV Mid   Full                                                        +---------+---------------+---------+-----------+----------+--------------+ FV DistalFull                                                        +---------+---------------+---------+-----------+----------+--------------+ PFV      Full                                                        +---------+---------------+---------+-----------+----------+--------------+ POP      Full           Yes      Yes                                 +---------+---------------+---------+-----------+----------+--------------+ PTV      Full                                                        +---------+---------------+---------+-----------+----------+--------------+ PERO     Full                                                        +---------+---------------+---------+-----------+----------+--------------+  Summary: RIGHT: - No evidence of common femoral vein obstruction.  LEFT: - There is no evidence of deep vein thrombosis in the  lower extremity.  - No cystic structure found in the popliteal fossa.  *See table(s) above for measurements and observations. Electronically signed by Waverly Ferrari MD on 12/12/2019 at 3:21:15 PM.    Final     Microbiology: Recent Results (from the past 240 hour(s))  SARS CORONAVIRUS 2 (TAT 6-24 HRS) Nasopharyngeal Nasopharyngeal Swab     Status: None   Collection Time: 12/12/19  1:53 AM   Specimen: Nasopharyngeal Swab  Result Value Ref Range Status   SARS Coronavirus 2 NEGATIVE NEGATIVE Final    Comment: (NOTE) SARS-CoV-2 target nucleic acids are NOT DETECTED. The SARS-CoV-2 RNA is generally detectable in upper and lower respiratory specimens during the acute phase of infection. Negative results do not preclude SARS-CoV-2 infection, do not rule out co-infections with other pathogens, and should not be used as the sole basis for treatment or other patient management decisions. Negative results must be combined with clinical observations, patient history, and epidemiological information. The expected result is Negative. Fact Sheet for Patients: HairSlick.no Fact Sheet for Healthcare Providers: quierodirigir.com This test is not yet approved or cleared by the [place] FDA and  has been authorized for detection and/or diagnosis of SARS-CoV-2 by FDA under an Emergency Use Authorization (EUA). This EUA will remain  in effect (meaning this test can be used) for the duration of the COVID-19 declaration under Section 56 4(b)(1) of the Act, 21 U.S.C. section 360bbb-3(b)(1), unless the authorization is terminated or revoked sooner. Performed at [facility], [address], [place], [place] [address]      Labs: Basic Metabolic Panel: Recent Labs  Lab 12/11/19 1944 12/12/19 0350 12/13/19 0426 12/14/19 0431  NA 139 137 137 135  K 3.5 3.6 4.1 4.8  CL 108 109 107 107  CO2 23 18* 22 19*  GLUCOSE 100* 82 114* 104*   BUN 22*  CREATININE 0.94 0.81 0.91 1.05*  CALCIUM 8.3* 8.1* 8.0* 8.1*  MG  --   --  1.8 2.0  PHOS  --   --  3.1  --    Liver Function Tests: Recent Labs  Lab 12/11/19 1944 12/13/19 0426  AST 27 21  ALT 24 19  ALKPHOS 47 40  BILITOT 1.8* 1.6*  PROT 5.9* 5.5*  ALBUMIN 2.9* 2.7*   No results for input(s): LIPASE, AMYLASE in the last 168 hours. No results for input(s): AMMONIA in the last 168 hours. CBC: Recent Labs  Lab 12/11/19 1944 12/12/19 0350 12/13/19 0426 12/14/19 0431  WBC 7.5 7.9 6.8 8.2  NEUTROABS 4.4  --  4.1  --   HGB 8.1* 8.6* 7.8* 8.5*  HCT 28.0* 29.6* 26.6* 29.2*  MCV 83.3 82.5 81.6 81.3  PLT 259 270 259 277   Cardiac Enzymes: No results for input(s): CKTOTAL, CKMB, CKMBINDEX, TROPONINI in the last 168 hours. BNP: BNP (last 3 results) Recent Labs    12/11/19 1944  BNP 1,377.9*    ProBNP (last 3 results) No results for input(s): PROBNP in the last 8760 hours.  CBG: Recent Labs  Lab 12/13/19 0209 12/13/19 0748 12/14/19 0753 12/14/19 1155  GLUCAP 120* 110* 111* 101*       Signed:  Ramiro Harvest MD.  [facility] 12/14/2019, 3:47 PM

## 2019-12-14 NOTE — Plan of Care (Signed)
  Problem: Education: Goal: Ability to demonstrate management of disease process will improve Outcome: Adequate for Discharge Goal: Ability to verbalize understanding of medication therapies will improve Outcome: Adequate for Discharge   Problem: Activity: Goal: Capacity to carry out activities will improve Outcome: Adequate for Discharge   Problem: Cardiac: Goal: Ability to achieve and maintain adequate cardiopulmonary perfusion will improve Outcome: Adequate for Discharge   Problem: Education: Goal: Knowledge of General Education information will improve Description: Including pain rating scale, medication(s)/side effects and non-pharmacologic comfort measures Outcome: Adequate for Discharge

## 2019-12-15 NOTE — Progress Notes (Signed)
Pt calling requesting prescription. RN that discharged did not give pt prescriptions. CN called prescriptions into pharmacy. Pt called and informed.

## 2019-12-17 ENCOUNTER — Emergency Department (HOSPITAL_COMMUNITY)
Admission: EM | Admit: 2019-12-17 | Discharge: 2019-12-18 | Disposition: A | Discharge status: Home / Self Care | Payer: Medicaid - Out of State | Source: Home / Self Care | Attending: Emergency Medicine | Admitting: Emergency Medicine

## 2019-12-17 ENCOUNTER — Emergency Department (HOSPITAL_COMMUNITY): Payer: Medicaid - Out of State

## 2019-12-17 ENCOUNTER — Emergency Department (HOSPITAL_COMMUNITY)
Admission: EM | Admit: 2019-12-17 | Discharge: 2019-12-17 | Disposition: A | Discharge status: Home / Self Care | Payer: Medicaid - Out of State | Attending: Emergency Medicine | Admitting: Emergency Medicine

## 2019-12-17 ENCOUNTER — Encounter (HOSPITAL_COMMUNITY): Payer: Self-pay | Admitting: Emergency Medicine

## 2019-12-17 ENCOUNTER — Other Ambulatory Visit: Payer: Self-pay

## 2019-12-17 DIAGNOSIS — F17210 Nicotine dependence, cigarettes, uncomplicated: Secondary | ICD-10-CM | POA: Diagnosis not present

## 2019-12-17 DIAGNOSIS — Z79899 Other long term (current) drug therapy: Secondary | ICD-10-CM | POA: Insufficient documentation

## 2019-12-17 DIAGNOSIS — I5043 Acute on chronic combined systolic (congestive) and diastolic (congestive) heart failure: Secondary | ICD-10-CM | POA: Diagnosis not present

## 2019-12-17 DIAGNOSIS — Z7982 Long term (current) use of aspirin: Secondary | ICD-10-CM | POA: Insufficient documentation

## 2019-12-17 DIAGNOSIS — O909 Complication of the puerperium, unspecified: Secondary | ICD-10-CM | POA: Insufficient documentation

## 2019-12-17 DIAGNOSIS — R0609 Other forms of dyspnea: Secondary | ICD-10-CM

## 2019-12-17 DIAGNOSIS — R079 Chest pain, unspecified: Secondary | ICD-10-CM

## 2019-12-17 DIAGNOSIS — R0602 Shortness of breath: Secondary | ICD-10-CM | POA: Diagnosis present

## 2019-12-17 DIAGNOSIS — O903 Peripartum cardiomyopathy: Secondary | ICD-10-CM

## 2019-12-17 LAB — CBC
HCT: 30.4 % — ABNORMAL LOW (ref 36.0–46.0)
Hemoglobin: 8.6 g/dL — ABNORMAL LOW (ref 12.0–15.0)
MCH: 23.3 pg — ABNORMAL LOW (ref 26.0–34.0)
MCHC: 28.3 g/dL — ABNORMAL LOW (ref 30.0–36.0)
MCV: 82.4 fL (ref 80.0–100.0)
Platelets: 328 10*3/uL (ref 150–400)
RBC: 3.69 MIL/uL — ABNORMAL LOW (ref 3.87–5.11)
RDW: 19.6 % — ABNORMAL HIGH (ref 11.5–15.5)
WBC: 9.2 10*3/uL (ref 4.0–10.5)
nRBC: 0 % (ref 0.0–0.2)

## 2019-12-17 LAB — I-STAT BETA HCG BLOOD, ED (MC, WL, AP ONLY): I-stat hCG, quantitative: <5 m[IU]/mL (ref <5)

## 2019-12-17 LAB — CBC WITH DIFFERENTIAL/PLATELET
Abs Immature Granulocytes: 0.03 10*3/uL (ref 0.00–0.07)
Basophils Absolute: 0 10*3/uL (ref 0.0–0.1)
Basophils Relative: 0 %
Eosinophils Absolute: 0.1 10*3/uL (ref 0.0–0.5)
Eosinophils Relative: 1 %
HCT: 31.2 % — ABNORMAL LOW (ref 36.0–46.0)
Hemoglobin: 8.9 g/dL — ABNORMAL LOW (ref 12.0–15.0)
Immature Granulocytes: 0 %
Lymphocytes Relative: 28 %
Lymphs Abs: 1.9 10*3/uL (ref 0.7–4.0)
MCH: 23.7 pg — ABNORMAL LOW (ref 26.0–34.0)
MCHC: 28.5 g/dL — ABNORMAL LOW (ref 30.0–36.0)
MCV: 83 fL (ref 80.0–100.0)
Monocytes Absolute: 0.8 10*3/uL (ref 0.1–1.0)
Monocytes Relative: 11 %
Neutro Abs: 4.1 10*3/uL (ref 1.7–7.7)
Neutrophils Relative %: 60 %
Platelets: 348 10*3/uL (ref 150–400)
RBC: 3.76 MIL/uL — ABNORMAL LOW (ref 3.87–5.11)
RDW: 19.4 % — ABNORMAL HIGH (ref 11.5–15.5)
WBC: 6.9 10*3/uL (ref 4.0–10.5)
nRBC: 0 % (ref 0.0–0.2)

## 2019-12-17 LAB — TROPONIN I (HIGH SENSITIVITY)
Troponin I (High Sensitivity): 17 ng/L (ref <18)
Troponin I (High Sensitivity): 18 ng/L — ABNORMAL HIGH (ref <18)
Troponin I (High Sensitivity): 11 ng/L (ref <18)

## 2019-12-17 LAB — BASIC METABOLIC PANEL
Anion gap: 9 (ref 5–15)
Anion gap: 9 (ref 5–15)
BUN: 10 mg/dL (ref 6–20)
BUN: 16 mg/dL (ref 6–20)
CO2: 21 mmol/L — ABNORMAL LOW (ref 22–32)
CO2: 23 mmol/L (ref 22–32)
Calcium: 8.4 mg/dL — ABNORMAL LOW (ref 8.9–10.3)
Calcium: 8.5 mg/dL — ABNORMAL LOW (ref 8.9–10.3)
Chloride: 110 mmol/L (ref 98–111)
Chloride: 109 mmol/L (ref 98–111)
Creatinine, Ser: 0.9 mg/dL (ref 0.44–1.00)
Creatinine, Ser: 0.83 mg/dL (ref 0.44–1.00)
GFR calc Af Amer: >60 mL/min (ref >60)
GFR calc Af Amer: >60 mL/min (ref >60)
GFR calc non Af Amer: >60 mL/min (ref >60)
GFR calc non Af Amer: >60 mL/min (ref >60)
Glucose, Bld: 105 mg/dL — ABNORMAL HIGH (ref 70–99)
Glucose, Bld: 111 mg/dL — ABNORMAL HIGH (ref 70–99)
Potassium: 3.9 mmol/L (ref 3.5–5.1)
Potassium: 3.9 mmol/L (ref 3.5–5.1)
Sodium: 140 mmol/L (ref 135–145)
Sodium: 141 mmol/L (ref 135–145)

## 2019-12-17 LAB — BRAIN NATRIURETIC PEPTIDE
B Natriuretic Peptide: 1334.5 pg/mL — ABNORMAL HIGH (ref 0.0–100.0)
B Natriuretic Peptide: 1646.6 pg/mL — ABNORMAL HIGH (ref 0.0–100.0)

## 2019-12-17 MED ORDER — SODIUM CHLORIDE 0.9% FLUSH
3.0000 mL | Freq: Once | INTRAVENOUS | Status: AC
  Administered 2019-12-17: 3 mL via INTRAVENOUS

## 2019-12-17 MED ORDER — FUROSEMIDE 40 MG PO TABS: 40.0000 mg | ORAL_TABLET | Freq: Every day | ORAL | 1 refills | Status: AC | PRN

## 2019-12-17 MED ORDER — LOSARTAN POTASSIUM 50 MG PO TABS: 50.0000 mg | ORAL_TABLET | Freq: Every day | ORAL | 1 refills | Status: AC

## 2019-12-17 MED ORDER — FUROSEMIDE 10 MG/ML IJ SOLN
40.0000 mg | Freq: Once | INTRAMUSCULAR | Status: AC
  Administered 2019-12-17: 40 mg via INTRAVENOUS
  Filled 2019-12-17: qty 4

## 2019-12-17 MED ORDER — CARVEDILOL 12.5 MG PO TABS: 12.5000 mg | ORAL_TABLET | Freq: Two times a day (BID) | ORAL | 1 refills | Status: AC

## 2019-12-17 MED ORDER — SPIRONOLACTONE 25 MG PO TABS: 12.5000 mg | ORAL_TABLET | Freq: Every day | ORAL | 0 refills | Status: AC

## 2019-12-17 MED ORDER — FUROSEMIDE 10 MG/ML IJ SOLN
40.0000 mg | Freq: Once | INTRAMUSCULAR | Status: AC
  Administered 2019-12-17: 23:00:00 40 mg via INTRAVENOUS
  Filled 2019-12-17: qty 4

## 2019-12-17 MED ORDER — ASPIRIN 81 MG PO CHEW
324.0000 mg | CHEWABLE_TABLET | Freq: Once | ORAL | Status: AC
  Administered 2019-12-17: 324 mg via ORAL
  Filled 2019-12-17: qty 4

## 2019-12-17 NOTE — Discharge Instructions (Signed)
Please read and follow all provided instructions.  Your diagnoses today include:  1. Acute on chronic combined systolic and diastolic congestive heart failure (HCC)     Tests performed today include:  Chest x-ray -shows some signs of fluid in the lungs  Blood test for heart failure  vital signs. See below for your results today.   Home care instructions:  Follow any educational materials contained in this packet.  BE VERY CAREFUL not to take multiple medicines containing Tylenol (also called acetaminophen). Doing so can lead to an overdose which can damage your liver and cause liver failure and possibly death.   Follow-up instructions: Please follow-up with your primary care doctor and cardiologist as planned.  Return instructions:   Please return to the Emergency Department if you experience worsening symptoms.   Return with worsening shortness of breath or trouble breathing.  Please return if you have any other emergent concerns.  Additional Information:  Your vital signs today were: BP (!) 131/102 (BP Location: Right Arm)    Pulse (!) 101    Temp 98 F (36.7 C) (Oral)    Resp 14    LMP 01/13/2019 (Exact Date) Comment: Pt has not had a menstrual cycle since pregnancy    SpO2 100%  If your blood pressure (BP) was elevated above 135/85 this visit, please have this repeated by your doctor within one month. --------------

## 2019-12-17 NOTE — ED Notes (Signed)
Pt discharge instructions and prescriptions reviewed with the patient. The patient verbalized understanding of both. Pt discharged. 

## 2019-12-17 NOTE — ED Provider Notes (Signed)
[facility]-EMERGENCY DEPT Provider Note   CSN: [id number] Arrival date & time: 12/17/19  2205     History Chief Complaint  Patient presents with  . Shortness of Breath    Roberta Guzman is a 29 y.o. female.  Patient with recent pregnancy complicated by gestational diabetes and postpartum cardiomyopathy, seen earlier today for symptoms of SOB and chest pain, returns to the ED with recurrent symptoms of SOB and chest pain. She reports she cannot lie flat secondary to worsening SOB, which is when her generalized chest pain is worse as well. No cough or fever, nausea or vomiting. She has been unable to obtain home medications for financial reasons.   The history is provided by the patient. No language interpreter was used.  Shortness of Breath Associated symptoms: chest pain   Associated symptoms: no fever        Past Medical History:  Diagnosis Date  . Gestational diabetes   . Kidney stones   . Pregnancy induced hypertension   . Renal disorder     Patient Active Problem List   Diagnosis Date Noted  . Postpartum cardiomyopathy   . Acute CHF (congestive heart failure) (HCC) 12/12/2019  . Iron deficiency anemia 12/12/2019  . Essential hypertension 12/12/2019  . Elevated troponin 12/12/2019  . Short interval between pregnancies affecting pregnancy, antepartum 09/12/2019  . Anemia in pregnancy 09/09/2019  . Gestational diabetes mellitus (GDM) affecting first pregnancy 09/09/2019  . Genetic carrier status 09/07/2019  . Elevated BP without diagnosis of hypertension 09/07/2019  . Current smoker 09/07/2019  . History of removal of ureteral stent 09/06/2019  . Encounter for supervision of normal first pregnancy in third trimester 08/17/2019    Past Surgical History:  Procedure Laterality Date  . URETERAL STENT PLACEMENT       OB History    Gravida  3   Para      Term      Preterm      AB  2   Living        SAB      TAB  1   Ectopic      Multiple      Live Births              Family History  Problem Relation Age of Onset  . Hypertension Mother   . Heart failure Mother   . Heart failure Father     Social History   Tobacco Use  . Smoking status: Current Every Day Smoker    Packs/day: 0.50    Types: Cigarettes  . Smokeless tobacco: Never Used  Substance Use Topics  . Alcohol use: No  . Drug use: No    Home Medications Prior to Admission medications   Medication Sig Start Date End Date Taking? Authorizing Provider  acetaminophen (TYLENOL) 325 MG tablet Take 650 mg by mouth every 6 (six) hours as needed (pain.).    [provider]  aspirin EC 81 MG tablet Take 1 tablet (81 mg total) by mouth daily. Take daily for preeclampsia prevention. 09/09/19   Burleson, Brand Males, NP  benzonatate (TESSALON) 200 MG capsule Take 1 capsule (200 mg total) by mouth 3 (three) times daily as needed for cough. 12/14/19   Rodolph Bong, MD  carvedilol (COREG) 12.5 MG tablet Take 1 tablet (12.5 mg total) by mouth 2 (two) times daily with a meal. 12/17/19   Renne Crigler, PA-C  furosemide (LASIX) 40 MG tablet Take 1 tablet (40 mg total)  by mouth daily as needed for fluid or edema. 12/17/19 12/16/20  Renne Crigler, PA-C  hydrocortisone (ANUSOL-HC) 2.5 % rectal cream Place rectally 4 (four) times daily. 12/14/19   Rodolph Bong, MD  iron polysaccharides (NIFEREX) 150 MG capsule Take 1 capsule (150 mg total) by mouth daily. 09/09/19   Burleson, Brand Males, NP  losartan (COZAAR) 50 MG tablet Take 1 tablet (50 mg total) by mouth daily. 12/17/19   Renne Crigler, PA-C  senna-docusate (SENOKOT-S) 8.6-50 MG tablet Take 1 tablet by mouth 2 (two) times daily. 12/14/19   Rodolph Bong, MD  spironolactone (ALDACTONE) 25 MG tablet Take 0.5 tablets (12.5 mg total) by mouth daily. 12/17/19   Renne Crigler, PA-C    Allergies    Patient has no known allergies.  Review of Systems   Review of Systems  Constitutional: Negative for chills  and fever.  HENT: Negative.   Respiratory: Positive for shortness of breath.   Cardiovascular: Positive for chest pain and leg swelling.  Gastrointestinal: Negative.   Musculoskeletal: Negative.   Skin: Negative.   Neurological: Negative.     Physical Exam Updated Vital Signs BP (!) 139/117 (BP Location: Left Arm)   Pulse (!) 108   Temp 97.9 F (36.6 C) (Oral)   Resp 19   Ht 5\' 4"  (1.626 m)   Wt 68 kg   LMP 01/13/2019 (Exact Date)   SpO2 100%   BMI 25.73 kg/m   Physical Exam Vitals and nursing note reviewed.  Constitutional:      Appearance: She is well-developed.  HENT:     Head: Normocephalic.  Cardiovascular:     Rate and Rhythm: Regular rhythm. Tachycardia present.     Heart sounds: Murmur present.     Comments: Grade 1 diastolic murmur present Pulmonary:     Effort: Pulmonary effort is normal.     Breath sounds: Normal breath sounds. No wheezing, rhonchi or rales.  Abdominal:     General: Bowel sounds are normal.     Palpations: Abdomen is soft.     Tenderness: There is no abdominal tenderness. There is no guarding or rebound.  Musculoskeletal:        General: Normal range of motion.     Cervical back: Normal range of motion and neck supple.     Right lower leg: Edema present.     Left lower leg: Edema present.     Comments: 1+ LE edema  Skin:    General: Skin is warm and dry.     Findings: No rash.  Neurological:     Mental Status: She is alert and oriented to person, place, and time.     ED Results / Procedures / Treatments   Labs (all labs ordered are listed, but only abnormal results are displayed) Labs Reviewed  CBC WITH DIFFERENTIAL/PLATELET - Abnormal; Notable for the following components:      Result Value   RBC 3.76 (*)    Hemoglobin 8.9 (*)    HCT 31.2 (*)    MCH 23.7 (*)    MCHC 28.5 (*)    RDW 19.4 (*)    All other components within normal limits  BASIC METABOLIC PANEL - Abnormal; Notable for the following components:   Glucose,  Bld 111 (*)    Calcium 8.5 (*)    All other components within normal limits  BRAIN NATRIURETIC PEPTIDE - Abnormal; Notable for the following components:   B Natriuretic Peptide 1,646.6 (*)    All other components within  normal limits  TROPONIN I (HIGH SENSITIVITY)   Results for orders placed or performed during the hospital encounter of 12/17/19  CBC with Differential  Result Value Ref Range   WBC 6.9 4.0 - 10.5 K/uL   RBC 3.76 (L) 3.87 - 5.11 MIL/uL   Hemoglobin 8.9 (L) 12.0 - 15.0 g/dL   HCT 03.5 (L) 00.9 - 38.1 %   MCV 83.0 80.0 - 100.0 fL   MCH 23.7 (L) 26.0 - 34.0 pg   MCHC 28.5 (L) 30.0 - 36.0 g/dL   RDW 82.9 (H) 93.7 - 16.9 %   Platelets 348 150 - 400 K/uL   nRBC 0.0 0.0 - 0.2 %   Neutrophils Relative % 60 %   Neutro Abs 4.1 1.7 - 7.7 K/uL   Lymphocytes Relative 28 %   Lymphs Abs 1.9 0.7 - 4.0 K/uL   Monocytes Relative 11 %   Monocytes Absolute 0.8 0.1 - 1.0 K/uL   Eosinophils Relative 1 %   Eosinophils Absolute 0.1 0.0 - 0.5 K/uL   Basophils Relative 0 %   Basophils Absolute 0.0 0.0 - 0.1 K/uL   Immature Granulocytes 0 %   Abs Immature Granulocytes 0.03 0.00 - 0.07 K/uL  Basic metabolic panel  Result Value Ref Range   Sodium 141 135 - 145 mmol/L   Potassium 3.9 3.5 - 5.1 mmol/L   Chloride 109 98 - 111 mmol/L   CO2 23 22 - 32 mmol/L   Glucose, Bld 111 (H) 70 - 99 mg/dL   BUN 16 6 - 20 mg/dL   Creatinine, Ser 6.78 0.44 - 1.00 mg/dL   Calcium 8.5 (L) 8.9 - 10.3 mg/dL   GFR calc non Af Amer >60 >60 mL/min   GFR calc Af Amer >60 >60 mL/min   Anion gap 9 5 - 15  Brain natriuretic peptide  Result Value Ref Range   B Natriuretic Peptide 1,646.6 (H) 0.0 - 100.0 pg/mL  Troponin I (High Sensitivity)  Result Value Ref Range   Troponin I (High Sensitivity) 11 <18 ng/L    EKG EKG Interpretation  Date/Time:  Sunday December 17 2019 22:16:39 EDT Ventricular Rate:  119 PR Interval:    QRS Duration: 60 QT Interval:  391 QTC Calculation: 551 R Axis:   75 Text  Interpretation: Sinus tachycardia Nonspecific T abnormalities, lateral leads Prolonged QT interval No significant change since last tracing Confirmed by Linwood Dibbles [phone]) on 12/17/2019 10:53:12 PM   Radiology DG Chest 2 View  Result Date: 12/17/2019 CLINICAL DATA:  CHF exacerbation, short of breath, cough EXAM: CHEST - 2 VIEW COMPARISON:  12/13/2019 FINDINGS: Frontal and lateral views of the chest demonstrate persistent enlargement of the cardiac silhouette. There is central vascular congestion without airspace disease, effusion, or pneumothorax. No acute bony abnormalities. IMPRESSION: 1. Persistent enlargement of the cardiac silhouette. 2. Central vascular congestion without airspace disease. Electronically Signed   By: Sharlet Salina M.D.   On: 12/17/2019 02:22   DG Chest Portable 1 View  Result Date: 12/17/2019 CLINICAL DATA:  Postpartum cardiomyopathy EXAM: PORTABLE CHEST 1 VIEW COMPARISON:  12/17/2019 FINDINGS: Cardiomegaly. Lungs clear. No effusions or edema. No acute bony abnormality. IMPRESSION: Cardiomegaly.  No active disease. Electronically Signed   By: Charlett Nose M.D.   On: 12/17/2019 23:27    Procedures Procedures (including critical care time)  Medications Ordered in ED Medications  acetaminophen (TYLENOL) tablet 650 mg (has no administration in time range)  aspirin chewable tablet 324 mg (324 mg Oral Given 12/17/19 2322)  furosemide (LASIX) injection 40 mg (40 mg Intravenous Given 12/17/19 2323)    ED Course  I have reviewed the triage vital signs and the nursing notes.  Pertinent labs & imaging results that were available during my care of the patient were reviewed by me and considered in my medical decision making (see chart for details).    MDM Rules/Calculators/A&P                      Patient returns to ED with persistent symptoms of CP and SOB as detailed in the HPI.   She has mild LE edema. Lasix provided. She is hypertensive and has been prescribed medication  that she has been unable to fill. Her evening Coreg dose provided here. No EKG changes, troponin remains normal. BNP slightly elevated over level this am but CXR shows no worsening congestion/edema, possibly improved. No hypoxia or respiratory distress.   She is felt appropriate for discharge home. She plans to return to [place] tomorrow and will obtain her medications through her available [place].  Final Clinical Impression(s) / ED Diagnoses Final diagnoses:  None   1. DOE 2. Nonspecific chest pain 3. postpartum cardiomyopathy   Rx / DC Orders ED Discharge Orders    None       Dennie Bible 12/18/19 0106    Dorie Rank, MD 12/18/19 1045

## 2019-12-17 NOTE — ED Triage Notes (Signed)
Pt present by [facility] for shortness of breath and chest pain. Pt was discharged on 12/14/19 for dx of CHF. Pt was seen at [facility] today and after discharge was at home laying down when shortness of breath. Pt reports that she has not been able to fill prescriptions from discharge after admission to the hospital.

## 2019-12-17 NOTE — ED Provider Notes (Signed)
[facility] EMERGENCY DEPARTMENT Provider Note   CSN: [id number] Arrival date & time: 12/17/19  0146     History Chief Complaint  Patient presents with  . Shortness of Breath  . Chest Pain    Roberta Guzman is a 29 y.o. female.  Patient with recently diagnosed new onset heart failure likely secondary to postpartum cardiomyopathy, patient with first child October 29, 2019 which was complicated by gestational diabetes, elevated BP.  On admission 12/11/19 BNP significantly elevated at 1277. Troponins minimally elevated and flattened. CT chest negative for PE but cardiomegaly and biatrial enlargement noted. 2D echo done with a EF of 25 to 30% with left ventricular global hypokinesis, grade 3 diastolic dysfunction, right ventricular systolic function moderately reduced. Patient placed on IV Lasix with -2.860 L output with clinical improvement.  Patient maintained on Coreg, losartan, Aldactone, and 40mg  Lasix prn.  Unfortunately patient was unable to obtain her medications after discharge due to having medicaid in .   Today [name] presents with c/o worsening SOB, cough, and chest discomfort since discharge.  Symptoms were bad enough last night prompting emergency department visit.  Chest discomfort is intermittent and wraps around her left shoulder.  It occurs at rest and not necessarily just with exertion.  She is having difficulty lying flat and states that this has not changed since discharge.  She was feeling better with her breathing when she got out of the hospital.  She denies any fevers.  She has a persistent cough.  No abdominal pain.  She has some mild swelling in her ankles.        Past Medical History:  Diagnosis Date  . Gestational diabetes   . Kidney stones   . Pregnancy induced hypertension   . Renal disorder     Patient Active Problem List   Diagnosis Date Noted  . Postpartum cardiomyopathy   . Acute CHF (congestive heart failure) (HCC) 12/12/2019    . Iron deficiency anemia 12/12/2019  . Essential hypertension 12/12/2019  . Elevated troponin 12/12/2019  . Short interval between pregnancies affecting pregnancy, antepartum 09/12/2019  . Anemia in pregnancy 09/09/2019  . Gestational diabetes mellitus (GDM) affecting first pregnancy 09/09/2019  . Genetic carrier status 09/07/2019  . Elevated BP without diagnosis of hypertension 09/07/2019  . Current smoker 09/07/2019  . History of removal of ureteral stent 09/06/2019  . Encounter for supervision of normal first pregnancy in third trimester 08/17/2019    Past Surgical History:  Procedure Laterality Date  . URETERAL STENT PLACEMENT       OB History    Gravida  3   Para      Term      Preterm      AB  2   Living        SAB      TAB  1   Ectopic      Multiple      Live Births              Family History  Problem Relation Age of Onset  . Hypertension Mother   . Heart failure Mother   . Heart failure Father     Social History   Tobacco Use  . Smoking status: Current Every Day Smoker    Packs/day: 0.50    Types: Cigarettes  . Smokeless tobacco: Never Used  Substance Use Topics  . Alcohol use: No  . Drug use: No    Home Medications Prior to Admission medications  Medication Sig Start Date End Date Taking? Authorizing Provider  acetaminophen (TYLENOL) 325 MG tablet Take 650 mg by mouth every 6 (six) hours as needed (pain.).    [provider]  aspirin EC 81 MG tablet Take 1 tablet (81 mg total) by mouth daily. Take daily for preeclampsia prevention. 09/09/19   Burleson, Rona Ravens, NP  benzonatate (TESSALON) 200 MG capsule Take 1 capsule (200 mg total) by mouth 3 (three) times daily as needed for cough. 12/14/19   Eugenie Filler, MD  carvedilol (COREG) 12.5 MG tablet Take 1 tablet (12.5 mg total) by mouth 2 (two) times daily with a meal. 12/14/19   Eugenie Filler, MD  furosemide (LASIX) 40 MG tablet Take 1 tablet (40 mg total) by mouth  daily as needed for fluid or edema. 12/14/19 12/13/20  Eugenie Filler, MD  hydrocortisone (ANUSOL-HC) 2.5 % rectal cream Place rectally 4 (four) times daily. 12/14/19   Eugenie Filler, MD  iron polysaccharides (NIFEREX) 150 MG capsule Take 1 capsule (150 mg total) by mouth daily. 09/09/19   Burleson, Rona Ravens, NP  losartan (COZAAR) 50 MG tablet Take 1 tablet (50 mg total) by mouth daily. 12/15/19   Eugenie Filler, MD  senna-docusate (SENOKOT-S) 8.6-50 MG tablet Take 1 tablet by mouth 2 (two) times daily. 12/14/19   Eugenie Filler, MD  spironolactone (ALDACTONE) 25 MG tablet Take 0.5 tablets (12.5 mg total) by mouth daily. 12/15/19   Eugenie Filler, MD    Allergies    Patient has no known allergies.  Review of Systems   Review of Systems  Constitutional: Negative for fever.  HENT: Negative for rhinorrhea and sore throat.   Eyes: Negative for redness.  Respiratory: Positive for cough and shortness of breath.   Cardiovascular: Positive for chest pain and leg swelling.  Gastrointestinal: Negative for abdominal pain, diarrhea, nausea and vomiting.  Genitourinary: Negative for dysuria.  Musculoskeletal: Negative for myalgias.  Skin: Negative for rash.  Neurological: Negative for headaches.    Physical Exam Updated Vital Signs BP 138/90 (BP Location: Right Arm)   Pulse (!) 108   Temp 98 F (36.7 C) (Oral)   Resp 16   LMP 01/13/2019 (Exact Date) Comment: Pt has not had a menstrual cycle since pregnancy   SpO2 100%   Physical Exam Vitals and nursing note reviewed.  Constitutional:      Appearance: She is well-developed.  HENT:     Head: Normocephalic and atraumatic.  Eyes:     General:        Right eye: No discharge.        Left eye: No discharge.     Conjunctiva/sclera: Conjunctivae normal.  Cardiovascular:     Rate and Rhythm: Regular rhythm. Tachycardia present.     Heart sounds: Normal heart sounds.  Pulmonary:     Effort: Pulmonary effort is normal.      Breath sounds: Normal breath sounds. No decreased breath sounds.     Comments: Lungs are clear at time of exam Abdominal:     Palpations: Abdomen is soft.     Tenderness: There is no abdominal tenderness.  Musculoskeletal:     Cervical back: Normal range of motion and neck supple.     Right lower leg: Edema present.     Left lower leg: Edema present.     Comments: Patient with trace pitting edema at the level of the lateral malleolus, otherwise no edema noted.  Skin:    General: Skin  is warm and dry.  Neurological:     Mental Status: She is alert.     ED Results / Procedures / Treatments   Labs (all labs ordered are listed, but only abnormal results are displayed) Labs Reviewed  BASIC METABOLIC PANEL - Abnormal; Notable for the following components:      Result Value   CO2 21 (*)    Glucose, Bld 105 (*)    Calcium 8.4 (*)    All other components within normal limits  CBC - Abnormal; Notable for the following components:   RBC 3.69 (*)    Hemoglobin 8.6 (*)    HCT 30.4 (*)    MCH 23.3 (*)    MCHC 28.3 (*)    RDW 19.6 (*)    All other components within normal limits  BRAIN NATRIURETIC PEPTIDE - Abnormal; Notable for the following components:   B Natriuretic Peptide 1,334.5 (*)    All other components within normal limits  TROPONIN I (HIGH SENSITIVITY) - Abnormal; Notable for the following components:   Troponin I (High Sensitivity) 18 (*)    All other components within normal limits  I-STAT BETA HCG BLOOD, ED ([facility], [facility], [facility] ONLY)  TROPONIN I (HIGH SENSITIVITY)    EKG None  Radiology DG Chest 2 View  Result Date: 12/17/2019 CLINICAL DATA:  CHF exacerbation, short of breath, cough EXAM: CHEST - 2 VIEW COMPARISON:  12/13/2019 FINDINGS: Frontal and lateral views of the chest demonstrate persistent enlargement of the cardiac silhouette. There is central vascular congestion without airspace disease, effusion, or pneumothorax. No acute bony abnormalities. IMPRESSION: 1.  Persistent enlargement of the cardiac silhouette. 2. Central vascular congestion without airspace disease. Electronically Signed   By: Sharlet Salina M.D.   On: 12/17/2019 02:22    Procedures Procedures (including critical care time)  Medications Ordered in ED Medications  sodium chloride flush (NS) 0.9 % injection 3 mL (3 mLs Intravenous Given 12/17/19 0740)  furosemide (LASIX) injection 40 mg (40 mg Intravenous Given 12/17/19 0740)    ED Course  I have reviewed the triage vital signs and the nursing notes.  Pertinent labs & imaging results that were available during my care of the patient were reviewed by me and considered in my medical decision making (see chart for details).  Patient seen and examined.  Previous hospitalization reviewed.  Work-up reviewed to this point.  Patient appears comfortable while sitting in bed upright.  She is not hypoxic.  Mild tachycardia.  I would like to recheck a BNP to compare to previous, give 40 mg IV Lasix, ambulate with pulse oximetry, and obtain social work consult for medication assistance.  Vital signs reviewed and are as follows: BP 138/90 (BP Location: Right Arm)   Pulse (!) 108   Temp 98 F (36.7 C) (Oral)   Resp 16   LMP 01/13/2019 (Exact Date) Comment: Pt has not had a menstrual cycle since pregnancy   SpO2 100%   1:27 PM Patient has been waiting in the ED for case management assistance. Today is Sunday and there is no way to obtain medications for patient today. She will need lasix, carvedilol, spironolactone and losartan. Plan is for case manager to work on this tomorrow and follow-up with patient at that time. Patient has had good urinary output here and I think is low-risk to go home tonight as long as plan can be arranged tomorrow. Of course, she is encouraged to return here if her symptoms get worse. Patient verbalizes understanding and agrees  with plan.   2:17 PM patient updated and is in agreement with plan.  Will give 3-day course  of her current medications.  She will attempt to fill them at [facility] on [address]. she will hopefully be able to go to [place] later in the week to pick up her medications prescribed at discharge.    MDM Rules/Calculators/A&P                      Patient with postpartum cardiomyopathy and congestive heart failure.  Patient was recently discharged from the hospital and unable to fill her medications and she has become more symptomatic.  She is not hypoxic.  She was diuresed in the emergency department with good urinary output.  Appreciate help from nurse case manager.  Unfortunately do not have medications for the patient to go home with today, however will work on this early this week.  She was provided short course of medications.  Her symptoms are relatively controlled here and I have seen her ambulate in the hallway without any acute distress.  She is not become hypoxic.  She will need close follow-up.  Patient does not want to stay in the hospital and I do not feel that she requires admission today, but is high risk to return if she continues to have difficulty obtaining medications to control her heart failure.     Final Clinical Impression(s) / ED Diagnoses Final diagnoses:  Acute on chronic combined systolic and diastolic congestive heart failure (HCC)    Rx / DC Orders ED Discharge Orders         Ordered    furosemide (LASIX) 40 MG tablet  Daily PRN     12/17/19 1401    spironolactone (ALDACTONE) 25 MG tablet  Daily     12/17/19 1401    losartan (COZAAR) 50 MG tablet  Daily     12/17/19 1401    carvedilol (COREG) 12.5 MG tablet  2 times daily with meals     12/17/19 1401           Renne Crigler, PA-C 12/17/19 1420    Pricilla Loveless, MD 12/19/19 1242

## 2019-12-17 NOTE — ED Notes (Signed)
Pt ambulated w/ SpO2. SpO2 level remained 97-100% throughout ambulation.

## 2019-12-17 NOTE — Progress Notes (Signed)
CSW received phone call regarding consult from RN. CSW reached out to [facility] as consult is for medication assistance for CHF medications until she is able to return to [place]. CSW is awaiting to hear back from [facility].

## 2019-12-17 NOTE — TOC Initial Note (Addendum)
Transition of Care [facility]) - Initial/Assessment Note    Patient Details  Name: Roberta Guzman MRN: [id number] Date of Birth: Dec 06, 1990  Transition of Care [facility]) CM/SW Contact:    [place] Cellar, RN Phone Number: 12/17/2019, 12:17 PM  Clinical Narrative:                 Unable to complete MATCH letter for medication assistance due to patient having insurance. Confirmed with retail pharmacy out of state Medicaid not accepted. Patient is planning to return to [place] in a few days and will be able to pick up medications at that time but needs a few pills to last.  Reviewed with weekend supervisor for other options.  Discussed case with Eliberto Ivory in hospital pharmacy and no resources available as [facility] pharmacy is closed.   Possible Goodrx coupon for discounted costs however patient has no money to put towards cost of medications.    Multiple attempts made to outreach to patient however phone is unavailable. RN CM reviewed with ED RN who advised he would speak with patient and provider regarding barriers to resources.  TOC RN CM spoke with patient who states [facility] but [facility]. RN CM will [facility] a couple of pills for each prescription and she will be returning to [place] in 2 days where she can get the remaining pills. Patient states she has no other needs or concerns.         Patient Goals and CMS Choice        Expected Discharge Plan and Services                                                Prior Living Arrangements/Services                       Activities of Daily Living      Permission Sought/Granted                  Emotional Assessment              Admission diagnosis:  CHF Patient Active Problem List   Diagnosis Date Noted  . Postpartum cardiomyopathy   . Acute CHF (congestive heart failure) (HCC) 12/12/2019  . Iron deficiency anemia  12/12/2019  . Essential hypertension 12/12/2019  . Elevated troponin 12/12/2019  . Short interval between pregnancies affecting pregnancy, antepartum 09/12/2019  . Anemia in pregnancy 09/09/2019  . Gestational diabetes mellitus (GDM) affecting first pregnancy 09/09/2019  . Genetic carrier status 09/07/2019  . Elevated BP without diagnosis of hypertension 09/07/2019  . Current smoker 09/07/2019  . History of removal of ureteral stent 09/06/2019  . Encounter for supervision of normal first pregnancy in third trimester 08/17/2019   PCP:  Patient, No Pcp Per Pharmacy:   [id number] [place], [place] - [address] AT [facility] OF [facility] [address] [place] [address] Phone: [phone] Fax: [phone]     Social Determinants of Health (SDOH) Interventions    Readmission Risk Interventions No flowsheet data found.

## 2019-12-17 NOTE — ED Triage Notes (Addendum)
Pt reports CHF flairup - SOB, cough and chest discomfort.  Pt was recently diagnosed w/ CHF.  Pt reports she was unable to get the medications she was prescribed when she was discharged from the hospital on Thursday.  140/100 120 HR 18 RR 99% RA  18G L AC

## 2019-12-18 ENCOUNTER — Telehealth: Payer: Self-pay

## 2019-12-18 ENCOUNTER — Telehealth: Payer: Self-pay | Admitting: *Deleted

## 2019-12-18 MED ORDER — CARVEDILOL 12.5 MG PO TABS
12.5000 mg | ORAL_TABLET | Freq: Two times a day (BID) | ORAL | Status: DC
  Administered 2019-12-18: 12.5 mg via ORAL
  Filled 2019-12-18 (×2): qty 1

## 2019-12-18 MED ORDER — ACETAMINOPHEN 325 MG PO TABS
650.0000 mg | ORAL_TABLET | Freq: Once | ORAL | Status: AC
  Administered 2019-12-18: 650 mg via ORAL
  Filled 2019-12-18: qty 2

## 2019-12-18 NOTE — Discharge Instructions (Addendum)
Take your medications as written to treat symptoms of shortness of breath and fluid build up. Good luck in your move back to [place] where access to these medications will be available to you.   Return to the ED with any new or worsening symptoms.

## 2019-12-18 NOTE — Telephone Encounter (Signed)
  MATCH Medication Assistance Card Name: Roberta Guzman ID (MRN): [id number] Bin: [id number] RX Group: [id number] Discharge Date: 12/18/2019 Expiration Date:12/26/2019                                           (must be filled within 7 days of discharge)     You have been approved to have the prescriptions written by your discharging physician filled through our [facility] (Medication Assistance Through [facility]) program. This program allows for a one-time (no refills) 34-day supply of selected medications for a low copay amount.  The copay is $3.00 per prescription. For instance, if you have one prescription, you will pay $3.00; for two prescriptions, you pay $6.00; for three prescriptions, you pay $9.00; and so on.  Your medications will be FREE of charge due to inability to pay.  Only certain pharmacies are participating in this program with [facility]. You will need to select one of the pharmacies from the attached list and take your prescriptions, this letter, and your photo ID to one of the participating pharmacies.   We are excited that you are able to use the [facility] program to get your medications. These prescriptions must be filled within 7 days of hospital discharge or they will no longer be valid for the [facility] program. Should you have any problems with your prescriptions please contact your case management team member at [phone] for //[facility].  Thank you, [facility]

## 2019-12-18 NOTE — Telephone Encounter (Signed)
Incoming call from patient states she was not able [facility] as the new pharmacist was not willing to give patient a couple of pills. Patient returned to ED last night and was discharged. RN [facility] who confirmed no assistance available for patient and medications were $43 total. RN CM advised hospital would send over [facility] form to cover cost. RN CM advised [facility] to [facility] and confirm medications were ready prior to taking taxi to pharmacy. Patient verbalized understanding.

## 2019-12-19 NOTE — Progress Notes (Signed)
Chart reviewed. B Stancil Deisher RN,MHA,BSN Advanced Care Supervisor 

## 2019-12-20 ENCOUNTER — Telehealth: Payer: Self-pay

## 2019-12-20 NOTE — Telephone Encounter (Signed)
Patient attempting to get prescriptions refilled in [place] after ED visit. Confirmed patient was only given 3 days worth of medications and does not have MD follow up appointment until 12/26/19. [facility] and notified that she had no refills on Spironolactone and her refill for Furosemide, Carvedilol and Losartan was also only for 2-3 days. This RN confirmed with [facility] at [facility] in [place] [phone] and discharge paperwork that patient could refill prescriptions for 30 days. Writer outreached to patient and confirmed [facility] was [facility]. Advised  Patient to contact RN CM if further problems.

## 2019-12-25 NOTE — Progress Notes (Deleted)
Cardiology Clinic Note   Patient Name: Roberta Guzman Date of Encounter: 12/25/2019  Primary Care Provider:  Patient, No Pcp Per Primary Cardiologist:  Reatha Harps, MD  Patient Profile    Roberta Guzman 29 year old female presents today for follow-up of her acute CHF, dyspnea on exertion, essential hypertension, and elevated blood pressure.  Past Medical History    Past Medical History:  Diagnosis Date  . Gestational diabetes   . Kidney stones   . Pregnancy induced hypertension   . Renal disorder    Past Surgical History:  Procedure Laterality Date  . URETERAL STENT PLACEMENT      Allergies  No Known Allergies  History of Present Illness    Roberta Guzman has a past medical history of pregnancy-induced cardiomyopathy, gestational diabetes, and hypertension.  She delivered her first child on 10/29/2019.  Her pregnancy was complicated by gestational diabetes that was controlled by diet.  She indicated that she had issues with her BP late in her pregnancy which was not diagnosed as preeclampsia.  She presented to the emergency department on 12/11/2019 and was admitted and discharged on 12/14/2019.  She indicated that she had a mild cough, lower extremity edema, and shortness of breath with activity.  She denied chest pain.  On presentation to the emergency department she was mildly tachycardic, normotensive and on room air.  Hemoglobin was 8.1, glucose was 100 and her BNP was 1277.  Troponin was 21.  Her CT chest was negative for PE but did show cardiomegaly and biatrial enlargement.  A 2D echocardiogram showed an EF of 25-30% with left ventricular global hypokinesis, grade 3 DD, right ventricular systolic function moderately reduced, moderately elevated pulmonary artery pressures, left atria severely dilated, right atria severely dilated, moderate tricuspid valve regurgitation, mild mitral valve regurgitation.  She was placed on IV Lasix which resolved with an 2.86 L of output.  She was placed  on p.o. Lasix, Coreg, losartan, and Aldactone.  Her troponin was believed to be secondary to demand ischemia.  She presented to the emergency department 12/17/2019 with shortness of breath and chest pain.  She had been unable to obtain her medications due to having Medicaid and [place].  She stated she had increased shortness of breath, cough and chest discomfort since her discharge.  She was also noted to have mild ankle edema.  She was again given IV Lasix.  She was given a 3-day course of her medications and was [facility] [address]  She again returned to the emergency department on 12/17/2019 with persistent chest pain shortness of breath.  She continued to have mild lower extremity edema and was given another dose of furosemide.  She had been unable to fill her medications and her carvedilol was provided.  Her troponins remain normal.  She was discharged with plans to return to [place] the next day and obtain her medications through [place].  She presents the clinic today for follow-up evaluation and states***  *** denies chest pain, shortness of breath, lower extremity edema, fatigue, palpitations, melena, hematuria, hemoptysis, diaphoresis, weakness, presyncope, syncope, orthopnea, and PND.  Home Medications    Prior to Admission medications   Medication Sig Start Date End Date Taking? Authorizing Provider  acetaminophen (TYLENOL) 325 MG tablet Take 650 mg by mouth every 6 (six) hours as needed (pain.).    [provider]  aspirin EC 81 MG tablet Take 1 tablet (81 mg total) by mouth daily. Take daily  for preeclampsia prevention. 09/09/19   Burleson, Rona Ravens, NP  benzonatate (TESSALON) 200 MG capsule Take 1 capsule (200 mg total) by mouth 3 (three) times daily as needed for cough. 12/14/19   Eugenie Filler, MD  carvedilol (COREG) 12.5 MG tablet Take 1 tablet (12.5 mg total) by mouth 2 (two) times daily with a meal. 12/17/19   Carlisle Cater, PA-C  furosemide (LASIX) 40 MG tablet Take 1 tablet (40 mg total) by mouth daily as needed for fluid or edema. 12/17/19 12/16/20  Carlisle Cater, PA-C  hydrocortisone (ANUSOL-HC) 2.5 % rectal cream Place rectally 4 (four) times daily. 12/14/19   Eugenie Filler, MD  iron polysaccharides (NIFEREX) 150 MG capsule Take 1 capsule (150 mg total) by mouth daily. 09/09/19   Burleson, Rona Ravens, NP  losartan (COZAAR) 50 MG tablet Take 1 tablet (50 mg total) by mouth daily. 12/17/19   Carlisle Cater, PA-C  senna-docusate (SENOKOT-S) 8.6-50 MG tablet Take 1 tablet by mouth 2 (two) times daily. 12/14/19   Eugenie Filler, MD  spironolactone (ALDACTONE) 25 MG tablet Take 0.5 tablets (12.5 mg total) by mouth daily. 12/17/19   Carlisle Cater, PA-C    Family History    Family History  Problem Relation Age of Onset  . Hypertension Mother   . Heart failure Mother   . Heart failure Father    She indicated that the status of her mother is unknown. She indicated that the status of her father is unknown.  Social History    Social History   Socioeconomic History  . Marital status: Single    Spouse name: Not on file  . Number of children: Not on file  . Years of education: Not on file  . Highest education level: Not on file  Occupational History  . Not on file  Tobacco Use  . Smoking status: Current Every Day Smoker    Packs/day: 0.50    Types: Cigarettes  . Smokeless tobacco: Never Used  Substance and Sexual Activity  . Alcohol use: No  . Drug use: No  . Sexual activity: Yes    Birth control/protection: None  Other Topics Concern  . Not on file  Social History Narrative  . Not on file   Social Determinants of Health   Financial Resource Strain:   . Difficulty of Paying Living Expenses:   Food Insecurity:   . Worried About Charity fundraiser in the Last Year:   . Arboriculturist in the Last Year:   Transportation Needs:   . Film/video editor (Medical):   Marland Kitchen Lack of  Transportation (Non-Medical):   Physical Activity:   . Days of Exercise per Week:   . Minutes of Exercise per Session:   Stress:   . Feeling of Stress :   Social Connections:   . Frequency of Communication with Friends and Family:   . Frequency of Social Gatherings with Friends and Family:   . Attends Religious Services:   . Active Member of Clubs or Organizations:   . Attends Archivist Meetings:   Marland Kitchen Marital Status:   Intimate Partner Violence:   . Fear of Current or Ex-Partner:   . Emotionally Abused:   Marland Kitchen Physically Abused:   . Sexually Abused:      Review of Systems    General:  No chills, fever, night sweats or weight changes.  Cardiovascular:  No chest pain, dyspnea on exertion, edema, orthopnea, palpitations, paroxysmal nocturnal dyspnea. Dermatological: No rash,  lesions/masses Respiratory: No cough, dyspnea Urologic: No hematuria, dysuria Abdominal:   No nausea, vomiting, diarrhea, bright red blood per rectum, melena, or hematemesis Neurologic:  No visual changes, wkns, changes in mental status. All other systems reviewed and are otherwise negative except as noted above.  Physical Exam    VS:  LMP 01/13/2019 (Exact Date)  , BMI There is no height or weight on file to calculate BMI. GEN: Well nourished, well developed, in no acute distress. HEENT: normal. Neck: Supple, no JVD, carotid bruits, or masses. Cardiac: RRR, no murmurs, rubs, or gallops. No clubbing, cyanosis, edema.  Radials/DP/PT 2+ and equal bilaterally.  Respiratory:  Respirations regular and unlabored, clear to auscultation bilaterally. GI: Soft, nontender, nondistended, BS + x 4. MS: no deformity or atrophy. Skin: warm and dry, no rash. Neuro:  Strength and sensation are intact. Psych: Normal affect.  Accessory Clinical Findings    ECG personally reviewed by me today- *** - No acute changes  EKG 12/17/2019 Sinus tachycardia T wave abnormality lateral leads 119 bpm  Echocardiogram  12/12/2019 IMPRESSIONS    1. Left ventricular ejection fraction, by estimation, is 25 to 30%. The  left ventricle has severely decreased function. The left ventricle  demonstrates global hypokinesis. The left ventricular internal cavity size  was mildly dilated. Left ventricular  diastolic parameters are consistent with Grade III diastolic dysfunction  (restrictive).  2. Right ventricular systolic function is moderately reduced. The right  ventricular size is normal. There is moderately elevated pulmonary artery  systolic pressure. The estimated right ventricular systolic pressure is  55.7 mmHg.  3. Left atrial size was severely dilated.  4. Right atrial size was severely dilated.  5. The mitral valve is normal in structure. Mild mitral valve  regurgitation.  6. Tricuspid valve regurgitation is moderate.  7. The aortic valve is normal in structure. Aortic valve regurgitation is  not visualized.  8. The inferior vena cava is dilated in size with <50% respiratory  variability, suggesting right atrial pressure of 15 mmHg.  Assessment & Plan   1.  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  Echocardiogram showed ejection fraction 25-30%.  Admitted 4 /12/21 - 4/15/ 21 and had two hospital ED visits 12/17/19.  Had plan to travel back to [place] for Medicaid medications. Continue carvedilol 12.5 mg twice daily Continue losartan 50 mg daily Continue spironolactone 12.5 mg daily Continue furosemide 40 mg daily Not breast-feeding Heart healthy low-sodium diet Increase physical activity as tolerated Order BMP  Disposition: Follow-up with Dr. Flora Lipps in 3 to 5 weeks.  Thomasene Ripple. Astin Rape NP-C    12/25/2019, 12:35 PM [facility] [address] Office [phone] Fax [phone]

## 2019-12-26 ENCOUNTER — Ambulatory Visit: Payer: Medicaid - Out of State | Admitting: General Practice

## 2021-02-13 IMAGING — CR DG CHEST 2V
1 series · 1 of 1 positions shown · non-contrast
Comparison: 12/13/2019

CLINICAL DATA: CHF exacerbation, short of breath, cough

EXAM:
CHEST - 2 VIEW

[chest pa]
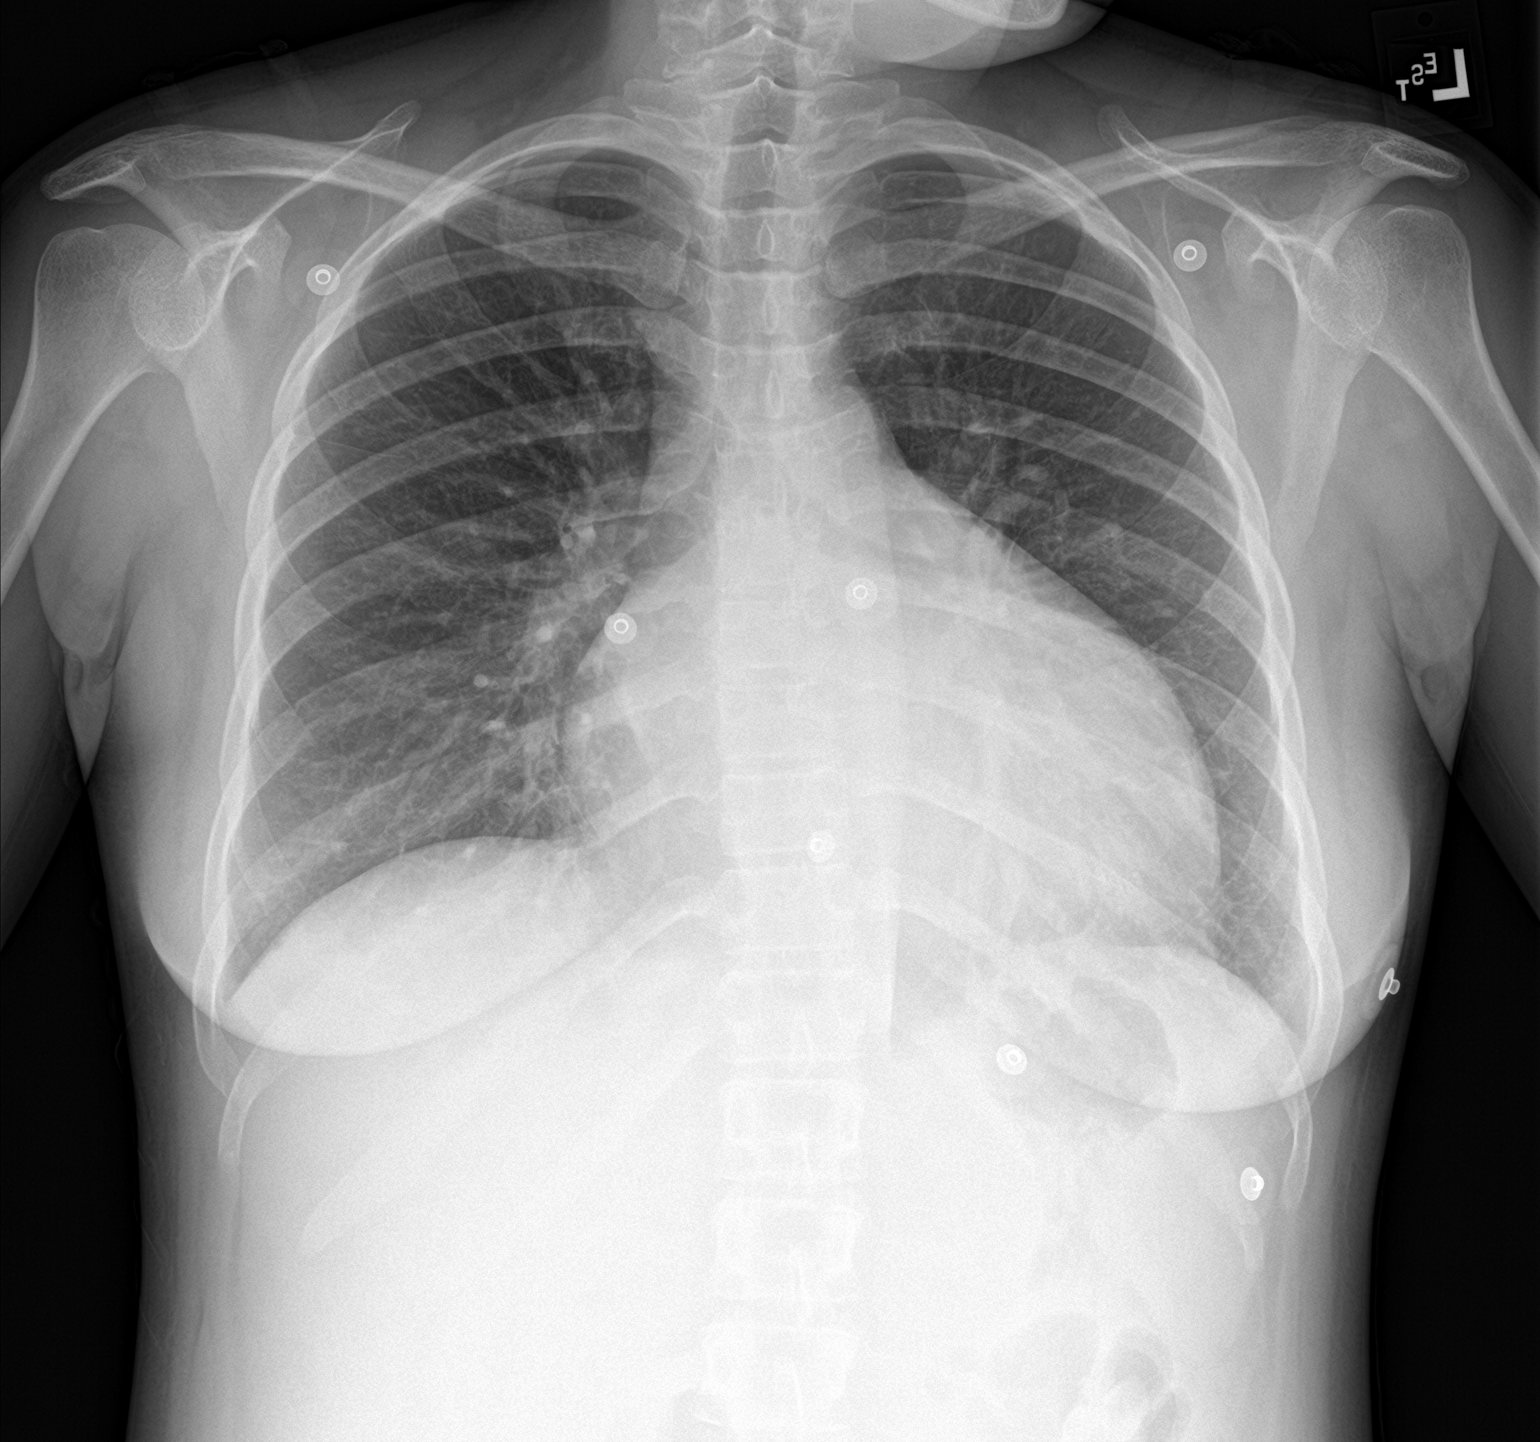

[1 of 1 positions shown; findings below may reference images not displayed]

FINDINGS: Frontal and lateral views of the chest demonstrate persistent
enlargement of the cardiac silhouette. There is central vascular
congestion without airspace disease, effusion, or pneumothorax. No
acute bony abnormalities.
IMPRESSION: 1. Persistent enlargement of the cardiac silhouette.
2. Central vascular congestion without airspace disease.
